# Patient Record
Sex: Female | Born: 1950 | Race: Black or African American | Hispanic: No | Smoking: Former smoker
Health system: Southern US, Community
[De-identification: ages and names within clinical notes are randomized; demographics above are authoritative.]

## PROBLEM LIST (undated history)

## (undated) DIAGNOSIS — E785 Hyperlipidemia, unspecified: Secondary | ICD-10-CM

## (undated) DIAGNOSIS — E559 Vitamin D deficiency, unspecified: Secondary | ICD-10-CM

## (undated) DIAGNOSIS — R413 Other amnesia: Secondary | ICD-10-CM

## (undated) DIAGNOSIS — E039 Hypothyroidism, unspecified: Secondary | ICD-10-CM

## (undated) DIAGNOSIS — I10 Essential (primary) hypertension: Secondary | ICD-10-CM

## (undated) DIAGNOSIS — F32A Depression, unspecified: Secondary | ICD-10-CM

## (undated) HISTORY — DX: Essential (primary) hypertension: I10

## (undated) HISTORY — DX: Depression, unspecified: F32.A

## (undated) HISTORY — DX: Other amnesia: R41.3

## (undated) HISTORY — DX: Hyperlipidemia, unspecified: E78.5

## (undated) HISTORY — PX: COLONOSCOPY: SHX174

## (undated) HISTORY — DX: Hypothyroidism, unspecified: E03.9

## (undated) HISTORY — DX: Vitamin D deficiency, unspecified: E55.9

---

## 2017-02-26 ENCOUNTER — Other Ambulatory Visit: Payer: Self-pay | Admitting: Specialist

## 2017-02-26 DIAGNOSIS — R5381 Other malaise: Secondary | ICD-10-CM

## 2017-04-07 ENCOUNTER — Encounter: Payer: Self-pay | Admitting: Specialist

## 2019-02-16 ENCOUNTER — Ambulatory Visit: Payer: Commercial Indemnity | Admitting: Diagnostic Neuroimaging

## 2019-03-03 ENCOUNTER — Telehealth: Payer: Self-pay | Admitting: Diagnostic Neuroimaging

## 2019-03-03 NOTE — Telephone Encounter (Signed)
lvm for pt to call back to r/s 10/19 appt due to MD being out

## 2019-03-08 ENCOUNTER — Ambulatory Visit: Payer: Commercial Indemnity | Admitting: Diagnostic Neuroimaging

## 2019-04-12 ENCOUNTER — Encounter: Payer: Self-pay | Admitting: *Deleted

## 2019-04-14 ENCOUNTER — Encounter: Payer: Self-pay | Admitting: Diagnostic Neuroimaging

## 2019-04-14 ENCOUNTER — Ambulatory Visit (INDEPENDENT_AMBULATORY_CARE_PROVIDER_SITE_OTHER): Payer: Self-pay | Admitting: Diagnostic Neuroimaging

## 2019-04-14 ENCOUNTER — Other Ambulatory Visit: Payer: Self-pay

## 2019-04-14 VITALS — BP 140/77 | HR 78 | Temp 97.9°F | Ht 63.0 in | Wt 104.6 lb

## 2019-04-14 DIAGNOSIS — G3184 Mild cognitive impairment, so stated: Secondary | ICD-10-CM | POA: Diagnosis not present

## 2019-04-14 DIAGNOSIS — R413 Other amnesia: Secondary | ICD-10-CM

## 2019-04-14 NOTE — Patient Instructions (Signed)
MILD MEMORY LOSS (MMSE 18/30; no change in ADLs; MCI vs mild dementia vs other secondary cause) - check MRI brain, B12, TSH - consider memantine 10mg  at bedtime; increase to twice a day after 1-2 weeks - safety / supervision issues reviewed - caution with driving and finances

## 2019-04-14 NOTE — Progress Notes (Signed)
[facility]  PATIENT: Gail Knapp DOB: 11-25-1950  REFERRING CLINICIAN: Cleda Mccreedy, Md [address] [place],  [place] [address]  HISTORY FROM: patient  REASON FOR VISIT: new consult    HISTORICAL  CHIEF COMPLAINT:  Chief Complaint  Patient presents with  . Memory Loss    rm 6 New Pt "sometimes I forget where I'm going but I remember later"  MMSE 18    HISTORY OF PRESENT ILLNESS:   68 year old female here for evaluation memory loss.  Patient reports subjective mild short-term memory loss and forgetfulness for past 4 to 5 months.  This has been noticed by patient and her son.  No change in ADLs.  Patient previously worked in multiple jobs and retired 2 years ago ([employer], [employer], [place]   She was living in [place] at the time and then moved to [place] to help her daughter with her family.  3 months ago patient decided to move in with son, as it was too stressful living with daughter.  Also in the past 3 months patient has gone back to work at [employer].    Patient still able to take care of personal affairs, hygiene, bathing, dressing, household chores, driving, shopping, going to work.  No change in ADLs.  Patient's mother had some memory problem, possible dementia.  No issues with sleep, stress, anxiety or depression.   REVIEW OF SYSTEMS: Full 14 system review of systems performed and negative with exception of: As per HPI.  ALLERGIES: Allergies  Allergen Reactions  . Latex Rash    HOME MEDICATIONS: Outpatient Medications Prior to Visit  Medication Sig Dispense Refill  . Cholecalciferol (VITAMIN D-3) 125 MCG (5000 UT) TABS Take 1 capsule by mouth daily.    Marland Kitchen diltiazem (CARDIZEM CD) 240 MG 24 hr capsule Take 240 mg by mouth daily.    . hydrochlorothiazide (HYDRODIURIL) 25 MG tablet Take 25 mg by mouth daily.    Marland Kitchen lovastatin (MEVACOR) 20 MG tablet Take 20 mg by mouth at bedtime.    . potassium  chloride (KLOR-CON) 10 MEQ tablet Take 10 mEq by mouth daily.     No facility-administered medications prior to visit.     PAST MEDICAL HISTORY: Past Medical History:  Diagnosis Date  . Hyperlipidemia   . Hypertension   . Memory change     PAST SURGICAL HISTORY: History reviewed. No pertinent surgical history.  FAMILY HISTORY: Family History  Problem Relation Age of Onset  . Hypertension Mother   . Hypertension Brother     SOCIAL HISTORY: Social History   Socioeconomic History  . Marital status: Divorced    Spouse name: Not on file  . Number of children: 2  . Years of education: Not on file  . Highest education level: High school graduate  Occupational History    Comment: [employer] part time  Social Needs  . Financial resource strain: Not on file  . Food insecurity    Worry: Not on file    Inability: Not on file  . Transportation needs    Medical: Not on file    Non-medical: Not on file  Tobacco Use  . Smoking status: Former Smoker    Packs/day: 1.00    Quit date: 04/14/2015    Years since quitting: 4.0  . Smokeless tobacco: Never Used  Substance and Sexual Activity  . Alcohol use: Not Currently  . Drug use: Never  . Sexual activity: Not on file  Lifestyle  .  Physical activity    Days per week: Not on file    Minutes per session: Not on file  . Stress: Not on file  Relationships  . Social Musicianconnections    Talks on phone: Not on file    Gets together: Not on file    Attends religious service: Not on file    Active member of club or organization: Not on file    Attends meetings of clubs or organizations: Not on file    Relationship status: Not on file  . Intimate partner violence    Fear of current or ex partner: Not on file    Emotionally abused: Not on file    Physically abused: Not on file    Forced sexual activity: Not on file  Other Topics Concern  . Not on file  Social History Narrative   04/14/19 Lives with son, [name]   Caffeine-none      PHYSICAL EXAM  GENERAL EXAM/CONSTITUTIONAL: Vitals:  Vitals:   04/14/19 0831  BP: 140/77  Pulse: 78  Temp: 97.9 F (36.6 C)  Weight: 104 lb 9.6 oz (47.4 kg)  Height: 5\' 3"  (1.6 m)     Body mass index is 18.53 kg/m. Wt Readings from Last 3 Encounters:  04/14/19 104 lb 9.6 oz (47.4 kg)     Patient is in no distress; well developed, nourished and groomed; neck is supple  CARDIOVASCULAR:  Examination of carotid arteries is normal; no carotid bruits  Regular rate and rhythm, no murmurs  Examination of peripheral vascular system by observation and palpation is normal  EYES:  Ophthalmoscopic exam of optic discs and posterior segments is normal; no papilledema or hemorrhages  No exam data present  MUSCULOSKELETAL:  Gait, strength, tone, movements noted in Neurologic exam below  NEUROLOGIC: MENTAL STATUS:  MMSE - Mini Mental State Exam 04/14/2019  Orientation to time 3  Orientation to Place 4  Registration 3  Attention/ Calculation 0  Recall 1  Language- name 2 objects 2  Language- repeat 0  Language- follow 3 step command 3  Language- read & follow direction 1  Write a sentence 1  Copy design 0  Total score 18    awake, alert, oriented to person, place  [facility] memory   DECR attention and concentration  language fluent, comprehension intact, naming intact  fund of knowledge appropriate  CRANIAL NERVE:   2nd - no papilledema on fundoscopic exam  2nd, 3rd, 4th, 6th - pupils equal and reactive to light, visual fields full to confrontation, extraocular muscles intact, no nystagmus  5th - facial sensation symmetric  7th - facial strength symmetric  8th - hearing intact  9th - palate elevates symmetrically, uvula midline  11th - shoulder shrug symmetric  12th - tongue protrusion midline  MOTOR:   normal bulk and tone, full strength in the BUE, BLE  SENSORY:   normal and symmetric to light touch, temperature, vibration  COORDINATION:    finger-nose-finger, fine finger movements normal  REFLEXES:   deep tendon reflexes present and symmetric  GAIT/STATION:   narrow based gait     DIAGNOSTIC DATA (LABS, IMAGING, TESTING) - I reviewed patient records, labs, notes, testing and imaging myself where available.  No results found for: WBC, HGB, HCT, MCV, PLT No results found for: NA, K, CL, CO2, GLUCOSE, BUN, CREATININE, CALCIUM, PROT, ALBUMIN, AST, ALT, ALKPHOS, BILITOT, GFRNONAA, GFRAA No results found for: CHOL, HDL, LDLCALC, LDLDIRECT, TRIG, CHOLHDL No results found for: [id number] No results found for: VITAMINB12 No  results found for: TSH     ASSESSMENT AND PLAN  68 y.o. year old female here with mild memory loss, no change in ADLs, with somewhat unexpectedly low MMSE given her history of high school graduation and working career.  We will proceed with further work-up.   Dx:  1. MCI (mild cognitive impairment)   2. Memory loss     PLAN:  MILD MEMORY LOSS (MMSE 18/30; no change in ADLs; MCI vs mild dementia vs other secondary cause) - check MRI brain, B12, TSH - consider memantine 10mg  at bedtime; increase to twice a day after 1-2 weeks - safety / supervision issues reviewed - caregiver resources provided - caution with driving and finances  Orders Placed This Encounter  Procedures  . MR BRAIN W WO CONTRAST  . CBC with Differential/Platelet  . Comprehensive metabolic panel  . Vitamin B12  . TSH   Return for pending if symptoms worsen or fail to improve. pending test results.    , MD 04/14/2019, 9:27 AM Certified in Neurology, Neurophysiology and Neuroimaging  [facility] [address], [address] [place], [place] [phone]

## 2019-04-15 LAB — CBC WITH DIFFERENTIAL/PLATELET
Basophils Absolute: 0 10*3/uL (ref 0.0–0.2)
Basos: 0 %
EOS (ABSOLUTE): 0 10*3/uL (ref 0.0–0.4)
Eos: 0 %
Hematocrit: 37.8 % (ref 34.0–46.6)
Hemoglobin: 11.9 g/dL (ref 11.1–15.9)
Immature Grans (Abs): 0 10*3/uL (ref 0.0–0.1)
Immature Granulocytes: 0 %
Lymphocytes Absolute: 1.2 10*3/uL (ref 0.7–3.1)
Lymphs: 22 %
MCH: 28.7 pg (ref 26.6–33.0)
MCHC: 31.5 g/dL (ref 31.5–35.7)
MCV: 91 fL (ref 79–97)
Monocytes Absolute: 0.4 10*3/uL (ref 0.1–0.9)
Monocytes: 7 %
Neutrophils Absolute: 3.9 10*3/uL (ref 1.4–7.0)
Neutrophils: 71 %
Platelets: 158 10*3/uL (ref 150–450)
RBC: 4.15 x10E6/uL (ref 3.77–5.28)
RDW: 11.3 % — ABNORMAL LOW (ref 11.7–15.4)
WBC: 5.5 10*3/uL (ref 3.4–10.8)

## 2019-04-15 LAB — COMPREHENSIVE METABOLIC PANEL
ALT: 10 IU/L (ref 0–32)
AST: 17 IU/L (ref 0–40)
Albumin/Globulin Ratio: 2 (ref 1.2–2.2)
Albumin: 4.2 g/dL (ref 3.8–4.8)
Alkaline Phosphatase: 116 IU/L (ref 39–117)
BUN/Creatinine Ratio: 14 (ref 12–28)
BUN: 12 mg/dL (ref 8–27)
Bilirubin Total: 0.3 mg/dL (ref 0.0–1.2)
CO2: 25 mmol/L (ref 20–29)
Calcium: 10.1 mg/dL (ref 8.7–10.3)
Chloride: 103 mmol/L (ref 96–106)
Creatinine, Ser: 0.85 mg/dL (ref 0.57–1.00)
GFR calc Af Amer: 82 mL/min/{1.73_m2} (ref >59)
GFR calc non Af Amer: 71 mL/min/{1.73_m2} (ref >59)
Globulin, Total: 2.1 g/dL (ref 1.5–4.5)
Glucose: 81 mg/dL (ref 65–99)
Potassium: 3.7 mmol/L (ref 3.5–5.2)
Sodium: 142 mmol/L (ref 134–144)
Total Protein: 6.3 g/dL (ref 6.0–8.5)

## 2019-04-15 LAB — TSH: TSH: 0.327 u[IU]/mL — ABNORMAL LOW (ref 0.450–4.500)

## 2019-04-15 LAB — VITAMIN B12: Vitamin B-12: 391 pg/mL (ref 232–1245)

## 2019-04-20 ENCOUNTER — Telehealth: Payer: Self-pay | Admitting: *Deleted

## 2019-04-26 ENCOUNTER — Telehealth: Payer: Self-pay | Admitting: Diagnostic Neuroimaging

## 2019-04-26 NOTE — Telephone Encounter (Signed)
I have called patient and her soon x 2 relayed I need to them to call me back to verify insurance for MRI . Thanks Hinton Dyer

## 2019-04-27 NOTE — Telephone Encounter (Signed)
Left vm for patient to call back about lab work results. Pts thyroid is low.  ------

## 2019-05-03 NOTE — Telephone Encounter (Signed)
Spoke with patient who stated she had gotten a message about her labs. She is aware her TSH is slightly low; follow up with PCP and other labs are okay. Patient verbalized understanding, appreciation.

## 2019-05-21 DIAGNOSIS — R413 Other amnesia: Secondary | ICD-10-CM

## 2019-05-21 HISTORY — DX: Other amnesia: R41.3

## 2019-05-24 ENCOUNTER — Telehealth: Payer: Self-pay

## 2019-05-24 NOTE — Telephone Encounter (Signed)
Patient dropped off new insurance and would like to proceed with scheduling MRI. It will need to be sent to GI once Berkley Harvey is completed. DWD

## 2019-05-27 ENCOUNTER — Telehealth: Payer: Self-pay | Admitting: *Deleted

## 2019-05-27 NOTE — Telephone Encounter (Signed)
Pt called need MRI results. Please [phone]

## 2019-05-27 NOTE — Telephone Encounter (Signed)
Per son Gail Knapp, on [place] please call him to schedule MRI.

## 2019-05-27 NOTE — Telephone Encounter (Addendum)
Called patient and asked where she had MRI done because not finding it in EMR. She stated she hasn't had it done, asked what she needs to do. I noted phone note by Annabelle Harman who needed updated insurance information. I asked her for this, skyped Annabelle Harman who then said she had spoken with patient and had insurance info. Son, [name] on DPR got on phone and verified insurance cards we scanned 05/24/19 are correct. He asked to be called to schedule MRI when insurance approves it; will notify Annabelle Harman of this. He verbalized understanding, appreciation.

## 2019-05-27 NOTE — Telephone Encounter (Signed)
Patient called me back and she has the [place] and / medicare . I will Get her MRI started .

## 2019-06-01 NOTE — Telephone Encounter (Signed)
Called and and got auth . For [facility] sent via Fax [phone]. I made them aware they had to call patient's son [name] [phone].

## 2019-06-01 NOTE — Telephone Encounter (Signed)
Berkley Harvey has been done sent to [facility]

## 2019-06-02 NOTE — Telephone Encounter (Signed)
Noted, thank you. DWD  

## 2019-06-23 ENCOUNTER — Ambulatory Visit
Admission: RE | Admit: 2019-06-23 | Discharge: 2019-06-23 | Disposition: A | Discharge status: Home / Self Care | Payer: Medicare HMO | Source: Ambulatory Visit | Attending: Diagnostic Neuroimaging | Admitting: Diagnostic Neuroimaging

## 2019-06-23 DIAGNOSIS — R413 Other amnesia: Secondary | ICD-10-CM

## 2019-06-23 MED ORDER — GADOBENATE DIMEGLUMINE 529 MG/ML IV SOLN
9.0000 mL | Freq: Once | INTRAVENOUS | Status: AC | PRN
  Administered 2019-06-23: 16:00:00 9 mL via INTRAVENOUS

## 2019-07-12 ENCOUNTER — Ambulatory Visit (INDEPENDENT_AMBULATORY_CARE_PROVIDER_SITE_OTHER): Payer: Medicare HMO | Admitting: Diagnostic Neuroimaging

## 2019-07-12 ENCOUNTER — Other Ambulatory Visit: Payer: Self-pay

## 2019-07-12 ENCOUNTER — Encounter: Payer: Self-pay | Admitting: Diagnostic Neuroimaging

## 2019-07-12 VITALS — BP 144/81 | HR 84 | Temp 98.0°F | Ht 63.0 in | Wt 102.0 lb

## 2019-07-12 DIAGNOSIS — R413 Other amnesia: Secondary | ICD-10-CM

## 2019-07-12 DIAGNOSIS — G3184 Mild cognitive impairment, so stated: Secondary | ICD-10-CM

## 2019-07-12 NOTE — Progress Notes (Signed)
[facility]  PATIENT: Gail Knapp DOB: 10/29/1950  REFERRING CLINICIAN: No referring provider defined for this encounter. HISTORY FROM: patient  REASON FOR VISIT: follow up   HISTORICAL  CHIEF COMPLAINT:  Chief Complaint  Patient presents with  . Mild cognitive impairment    rm 6, "review MRI results; I don't get lost, can drive to work, some days worse than others"  MMSE 18    HISTORY OF PRESENT ILLNESS:   UPDATE (07/12/19, VRP): Since last visit, doing about the same. Symptoms are stable. Severity is mild. No alleviating or aggravating factors. Still able to work at [employer].  NO change in ADLs.   PRIOR HPI (04/14/19): 69 year old female here for evaluation memory loss.  Patient reports subjective mild short-term memory loss and forgetfulness for past 4 to 5 months.  This has been noticed by patient and her son.  No change in ADLs.  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   She was living in [facility] [place] at the time and then moved to [place] to help her daughter with her family.  3 months ago patient decided to move in with son, as it was too stressful living with daughter.  Also in the past 3 months patient has gone back to work at [employer].    Patient still able to take care of personal affairs, hygiene, bathing, dressing, household chores, driving, shopping, going to work.  No change in ADLs.  Patient's mother had some memory problem, possible dementia.  No issues with sleep, stress, anxiety or depression.   REVIEW OF SYSTEMS: Full 14 system review of systems performed and negative with exception of: As per HPI.  ALLERGIES: Allergies  Allergen Reactions  . Latex Rash    HOME MEDICATIONS: Outpatient Medications Prior to Visit  Medication Sig Dispense Refill  . Cholecalciferol (VITAMIN D-3) 125 MCG (5000 UT) TABS Take 1 capsule by mouth daily.    Marland Kitchen  diltiazem (CARDIZEM CD) 240 MG 24 hr capsule Take 240 mg by mouth daily.    . hydrochlorothiazide (HYDRODIURIL) 25 MG tablet Take 25 mg by mouth daily.    Marland Kitchen lovastatin (MEVACOR) 20 MG tablet Take 20 mg by mouth at bedtime.    . potassium chloride (KLOR-CON) 10 MEQ tablet Take 10 mEq by mouth daily.     No facility-administered medications prior to visit.    PAST MEDICAL HISTORY: Past Medical History:  Diagnosis Date  . Hyperlipidemia   . Hypertension   . Memory change     PAST SURGICAL HISTORY: No past surgical history on file.  FAMILY HISTORY: Family History  Problem Relation Age of Onset  . Hypertension Mother   . Other Mother        "memory loss"  . Hypertension Brother     SOCIAL HISTORY: Social History   Socioeconomic History  . Marital status: Divorced    Spouse name: Not on file  . Number of children: 2  . Years of education: 73  . Highest education level: High school graduate  Occupational History    Comment: [employer] part time  Tobacco Use  . Smoking status: Former Smoker    Packs/day: 1.00    Quit date: 04/14/2015    Years since quitting: 4.2  . Smokeless tobacco: Never Used  Substance and Sexual Activity  . Alcohol use: Not Currently  . Drug use: Never  . Sexual activity: Not on file  Other Topics Concern  .  Not on file  Social History Narrative   2/22/21still  Lives with son, Biagio Quint   Social Determinants of Health   Financial Resource Strain:   . Difficulty of Paying Living Expenses: Not on file  Food Insecurity:   . Worried About Charity fundraiser in the Last Year: Not on file  . Ran Out of Food in the Last Year: Not on file  Transportation Needs:   . Lack of Transportation (Medical): Not on file  . Lack of Transportation (Non-Medical): Not on file  Physical Activity:   . Days of Exercise per Week: Not on file  . Minutes of Exercise per Session: Not on file  Stress:   . Feeling of Stress : Not on file  Social Connections:    . Frequency of Communication with Friends and Family: Not on file  . Frequency of Social Gatherings with Friends and Family: Not on file  . Attends Religious Services: Not on file  . Active Member of Clubs or Organizations: Not on file  . Attends Archivist Meetings: Not on file  . Marital Status: Not on file  Intimate Partner Violence:   . Fear of Current or Ex-Partner: Not on file  . Emotionally Abused: Not on file  . Physically Abused: Not on file  . Sexually Abused: Not on file     PHYSICAL EXAM  GENERAL EXAM/CONSTITUTIONAL: Vitals:  Vitals:   07/12/19 1618  BP: (!) 144/81  Pulse: 84  Temp: 98 F (36.7 C)  Weight: 102 lb (46.3 kg)  Height: 5\' 3"  (1.6 m)   Body mass index is 18.07 kg/m. Wt Readings from Last 3 Encounters:  07/12/19 102 lb (46.3 kg)  04/14/19 104 lb 9.6 oz (47.4 kg)    Patient is in no distress; well developed, nourished and groomed; neck is supple  CARDIOVASCULAR:  Examination of carotid arteries is normal; no carotid bruits  Regular rate and rhythm, no murmurs  Examination of peripheral vascular system by observation and palpation is normal  EYES:  Ophthalmoscopic exam of optic discs and posterior segments is normal; no papilledema or hemorrhages No exam data present  MUSCULOSKELETAL:  Gait, strength, tone, movements noted in Neurologic exam below  NEUROLOGIC: MENTAL STATUS:  MMSE - Graves Exam 07/12/2019 04/14/2019  Orientation to time 2 3  Orientation to Place 4 4  Registration 3 3  Attention/ Calculation 0 0  Recall 2 1  Language- name 2 objects 2 2  Language- repeat 0 0  Language- follow 3 step command 3 3  Language- read & follow direction 1 1  Write a sentence 1 1  Copy design 0 0  Total score 18 18    awake, alert, oriented to person, place  [facility] memory   DECR attention and concentration  language fluent, comprehension intact, naming intact  fund of knowledge appropriate  CRANIAL NERVE:     2nd - no papilledema on fundoscopic exam  2nd, 3rd, 4th, 6th - pupils equal and reactive to light, visual fields full to confrontation, extraocular muscles intact, no nystagmus  5th - facial sensation symmetric  7th - facial strength symmetric  8th - hearing intact  9th - palate elevates symmetrically, uvula midline  11th - shoulder shrug symmetric  12th - tongue protrusion midline  MOTOR:   normal bulk and tone, full strength in the BUE, BLE  SENSORY:   normal and symmetric to light touch, temperature, vibration  COORDINATION:   finger-nose-finger, fine finger movements  normal  REFLEXES:   deep tendon reflexes present and symmetric  GAIT/STATION:   narrow based gait     DIAGNOSTIC DATA (LABS, IMAGING, TESTING) - I reviewed patient records, labs, notes, testing and imaging myself where available.  Lab Results  Component Value Date   WBC 5.5 04/14/2019   HGB 11.9 04/14/2019   HCT 37.8 04/14/2019   MCV 91 04/14/2019   PLT 158 04/14/2019      Component Value Date/Time   NA 142 04/14/2019 0953   K 3.7 04/14/2019 0953   CL 103 04/14/2019 0953   CO2 25 04/14/2019 0953   GLUCOSE 81 04/14/2019 0953   BUN 12 04/14/2019 0953   CREATININE 0.85 04/14/2019 0953   CALCIUM 10.1 04/14/2019 0953   PROT 6.3 04/14/2019 0953   ALBUMIN 4.2 04/14/2019 0953   AST 17 04/14/2019 0953   ALT 10 04/14/2019 0953   ALKPHOS 116 04/14/2019 0953   BILITOT 0.3 04/14/2019 0953   GFRNONAA 71 04/14/2019 0953   GFRAA 82 04/14/2019 0953   No results found for: CHOL, HDL, LDLCALC, LDLDIRECT, TRIG, CHOLHDL No results found for: YEBX4D Lab Results  Component Value Date   VITAMINB12 391 04/14/2019   Lab Results  Component Value Date   TSH 0.327 (L) 04/14/2019    06/23/19 MRI brain (with and without): -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 -Mild scattered foci of nonspecific lesions. -No acute findings.   ASSESSMENT AND PLAN  69 y.o. year old female  here with mild memory loss, no change in ADLs, with somewhat unexpectedly low MMSE given her history of high school graduation and working career.    Dx:  1. MCI (mild cognitive impairment)   2. Memory loss     PLAN:  MILD MEMORY LOSS (MMSE 18/30; no change in ADLs; MCI vs mild dementia vs other secondary cause) - may consider memantine 10mg  at bedtime; increase to twice a day after 1-2 weeks - safety / supervision issues reviewed - caregiver resources provided - caution with driving and finances - encouraged patient to establish with PCP - return in 6 months with son  Return in about 6 months (around 01/09/2020) for return with son at next visit.   01/11/2020, MD 07/12/2019, 4:59 PM Certified in Neurology, Neurophysiology and Neuroimaging  [facility] [address] [place], [place] [phone]

## 2020-01-10 ENCOUNTER — Ambulatory Visit: Payer: Medicare HMO | Admitting: Diagnostic Neuroimaging

## 2020-01-10 ENCOUNTER — Telehealth: Payer: Self-pay | Admitting: *Deleted

## 2020-01-10 ENCOUNTER — Encounter: Payer: Self-pay | Admitting: Diagnostic Neuroimaging

## 2020-01-10 NOTE — Telephone Encounter (Signed)
Patient was no show for follow up today. 

## 2020-02-28 ENCOUNTER — Encounter: Payer: Self-pay | Admitting: Diagnostic Neuroimaging

## 2020-02-28 ENCOUNTER — Telehealth: Payer: Self-pay | Admitting: Diagnostic Neuroimaging

## 2020-02-28 ENCOUNTER — Other Ambulatory Visit: Payer: Self-pay

## 2020-02-28 ENCOUNTER — Ambulatory Visit: Payer: Medicare HMO | Admitting: Diagnostic Neuroimaging

## 2020-02-28 VITALS — BP 140/72 | HR 64 | Ht 65.0 in | Wt 106.0 lb

## 2020-02-28 DIAGNOSIS — F0390 Unspecified dementia without behavioral disturbance: Secondary | ICD-10-CM

## 2020-02-28 DIAGNOSIS — F03A0 Unspecified dementia, mild, without behavioral disturbance, psychotic disturbance, mood disturbance, and anxiety: Secondary | ICD-10-CM

## 2020-02-28 NOTE — Progress Notes (Signed)
[facility]  PATIENT: Gail Knapp DOB: [birth year]  REFERRING CLINICIAN: No referring provider defined for this encounter. HISTORY FROM: patient and son REASON FOR VISIT: follow up   HISTORICAL  CHIEF COMPLAINT:  Chief Complaint  Patient presents with  . MCI    rm 7, 6 month FU    HISTORY OF PRESENT ILLNESS:   UPDATE (02/28/20, VRP): Since last visit, some progression of memory loss issues. Son at the visit here today. Has had some events of forgetting where she parked car when remote battery failed; also has lost debit card twice. Still working and driving. No alleviating or aggravating factors. Tolerating meds.   UPDATE (07/12/19, VRP): Since last visit, doing about the same. Symptoms are stable. Severity is mild. No alleviating or aggravating factors. Still able to work at [employer].  NO change in ADLs.   PRIOR HPI (04/14/19): 69 year old female here for evaluation memory loss.  Patient reports subjective mild short-term memory loss and forgetfulness for past 4 to 5 months.  This has been noticed by patient and her son.  No change in ADLs.  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   She was living in [facility] [place] at the time and then moved to [place] to help her daughter with her family.  3 months ago patient decided to move in with son, as it was too stressful living with daughter.  Also in the past 3 months patient has gone back to work at [employer].    Patient still able to take care of personal affairs, hygiene, bathing, dressing, household chores, driving, shopping, going to work.  No change in ADLs.  Patient's mother had some memory problem, possible dementia.  No issues with sleep, stress, anxiety or depression.   REVIEW OF SYSTEMS: Full 14 system review of systems performed and negative with exception of: As per HPI.  ALLERGIES: Allergies  Allergen Reactions   . Latex Rash    HOME MEDICATIONS: Outpatient Medications Prior to Visit  Medication Sig Dispense Refill  . Cholecalciferol (VITAMIN D-3) 125 MCG (5000 UT) TABS Take 1 capsule by mouth daily.    Marland Kitchen diltiazem (CARDIZEM CD) 240 MG 24 hr capsule Take 240 mg by mouth daily.    . hydrochlorothiazide (HYDRODIURIL) 25 MG tablet Take 25 mg by mouth daily.    Marland Kitchen lovastatin (MEVACOR) 20 MG tablet Take 20 mg by mouth at bedtime.    . potassium chloride (KLOR-CON) 10 MEQ tablet Take 10 mEq by mouth daily.     No facility-administered medications prior to visit.    PAST MEDICAL HISTORY: Past Medical History:  Diagnosis Date  . Hyperlipidemia   . Hypertension   . Memory change     PAST SURGICAL HISTORY: No past surgical history on file.  FAMILY HISTORY: Family History  Problem Relation Age of Onset  . Hypertension Mother   . Other Mother        "memory loss"  . Hypertension Brother     SOCIAL HISTORY: Social History   Socioeconomic History  . Marital status: Divorced    Spouse name: Not on file  . Number of children: 2  . Years of education: 43  . Highest education level: High school graduate  Occupational History    Comment: [employer] part time  Tobacco Use  . Smoking status: Former Smoker    Packs/day: 1.00    Quit date: 04/14/2015    Years since quitting:  4.8  . Smokeless tobacco: Never Used  Substance and Sexual Activity  . Alcohol use: Not Currently  . Drug use: Never  . Sexual activity: Not on file  Other Topics Concern  . Not on file  Social History Narrative   2/22/21still  Lives with son, Gail Knapp 02/28/20   Caffeine-none   Social Determinants of Health   Financial Resource Strain:   . Difficulty of Paying Living Expenses: Not on file  Food Insecurity:   . Worried About Programme researcher, broadcasting/film/video in the Last Year: Not on file  . Ran Out of Food in the Last Year: Not on file  Transportation Needs:   . Lack of Transportation (Medical): Not on file  . Lack of  Transportation (Non-Medical): Not on file  Physical Activity:   . Days of Exercise per Week: Not on file  . Minutes of Exercise per Session: Not on file  Stress:   . Feeling of Stress : Not on file  Social Connections:   . Frequency of Communication with Friends and Family: Not on file  . Frequency of Social Gatherings with Friends and Family: Not on file  . Attends Religious Services: Not on file  . Active Member of Clubs or Organizations: Not on file  . Attends Banker Meetings: Not on file  . Marital Status: Not on file  Intimate Partner Violence:   . Fear of Current or Ex-Partner: Not on file  . Emotionally Abused: Not on file  . Physically Abused: Not on file  . Sexually Abused: Not on file     PHYSICAL EXAM  GENERAL EXAM/CONSTITUTIONAL: Vitals:  Vitals:   02/28/20 1500  Weight: 106 lb (48.1 kg)  Height: 5\' 5"  (1.651 m)   Body mass index is 17.64 kg/m. Wt Readings from Last 3 Encounters:  02/28/20 106 lb (48.1 kg)  07/12/19 102 lb (46.3 kg)  04/14/19 104 lb 9.6 oz (47.4 kg)    Patient is in no distress; well developed, nourished and groomed; neck is supple  CARDIOVASCULAR:  Examination of carotid arteries is normal; no carotid bruits  Regular rate and rhythm, no murmurs  Examination of peripheral vascular system by observation and palpation is normal  EYES:  Ophthalmoscopic exam of optic discs and posterior segments is normal; no papilledema or hemorrhages No exam data present  MUSCULOSKELETAL:  Gait, strength, tone, movements noted in Neurologic exam below  NEUROLOGIC: MENTAL STATUS:  MMSE - Mini Mental State Exam 02/28/2020 07/12/2019 04/14/2019  Orientation to time 1 2 3   Orientation to Place 4 4 4   Registration 3 3 3   Attention/ Calculation 0 0 0  Recall 1 2 1   Language- name 2 objects 2 2 2   Language- repeat 1 0 0  Language- follow 3 step command 3 3 3   Language- read & follow direction 1 1 1   Write a sentence 1 1 1   Copy  design 0 0 0  Total score 17 18 18     awake, alert, oriented to person, place  DECR memory   DECR attention and concentration  language fluent, comprehension intact, naming intact  fund of knowledge appropriate  CRANIAL NERVE:   2nd - no papilledema on fundoscopic exam  2nd, 3rd, 4th, 6th - pupils equal and reactive to light, visual fields full to confrontation, extraocular muscles intact, no nystagmus  5th - facial sensation symmetric  7th - facial strength symmetric  8th - hearing intact  9th - palate elevates symmetrically, uvula midline  11th - shoulder  shrug symmetric  12th - tongue protrusion midline  MOTOR:   normal bulk and tone, full strength in the BUE, BLE  SENSORY:   normal and symmetric to light touch, temperature, vibration  COORDINATION:   finger-nose-finger, fine finger movements normal  REFLEXES:   deep tendon reflexes present and symmetric  GAIT/STATION:   narrow based gait     DIAGNOSTIC DATA (LABS, IMAGING, TESTING) - I reviewed patient records, labs, notes, testing and imaging myself where available.  Lab Results  Component Value Date   WBC 5.5 04/14/2019   HGB 11.9 04/14/2019   HCT 37.8 04/14/2019   MCV 91 04/14/2019   PLT 158 04/14/2019      Component Value Date/Time   NA 142 04/14/2019 0953   K 3.7 04/14/2019 0953   CL 103 04/14/2019 0953   CO2 25 04/14/2019 0953   GLUCOSE 81 04/14/2019 0953   BUN 12 04/14/2019 0953   CREATININE 0.85 04/14/2019 0953   CALCIUM 10.1 04/14/2019 0953   PROT 6.3 04/14/2019 0953   ALBUMIN 4.2 04/14/2019 0953   AST 17 04/14/2019 0953   ALT 10 04/14/2019 0953   ALKPHOS 116 04/14/2019 0953   BILITOT 0.3 04/14/2019 0953   GFRNONAA 71 04/14/2019 0953   GFRAA 82 04/14/2019 0953   No results found for: CHOL, HDL, LDLCALC, LDLDIRECT, TRIG, CHOLHDL No results found for: LEXN1Z Lab Results  Component Value Date   VITAMINB12 391 04/14/2019   Lab Results  Component Value Date   TSH  0.327 (L) 04/14/2019    06/23/19 MRI brain (with and without): -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 -Mild scattered foci of nonspecific lesions. -No acute findings.   ASSESSMENT AND PLAN  70 y.o. year old female here with mild memory loss, some changes in ADLs, with unexpectedly low MMSE given her history of high school graduation and working career.  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   Dx:  1. Mild dementia (HCC)      PLAN:  MILD MEMORY LOSS (MMSE 17/30; mild-moderate dementia) - consider memantine 10mg  at bedtime; increase to twice a day after 1-2 weeks - safety / supervision issues reviewed - daily physical activity / exercise (at least 15-30 minutes) - eat more plants / vegetables - increase social activities, brain stimulation, games, puzzles, hobbies, crafts, arts, music - aim for at least 7-8 hours sleep per night (or more) - avoid smoking and alcohol - caregiver resources provided - recommend to stop driving; caution with finances and medications - follow up PCP  Return for return to PCP, pending if symptoms worsen or fail to improve.    , MD 02/28/2020, 3:18 PM Certified in Neurology, Neurophysiology and Neuroimaging  [facility] [address] [place], [place] [phone]

## 2020-02-28 NOTE — Telephone Encounter (Signed)
Called son who gave list of patient's current medications.

## 2020-02-28 NOTE — Telephone Encounter (Signed)
Pt's son(on [place], [name]) is asking for a call from Wynelle Cleveland re: the requested list of pt's medications, please call.

## 2020-02-28 NOTE — Patient Instructions (Signed)
MILD MEMORY LOSS (MMSE 17/30; mild-moderate dementia) - consider memantine 10mg  at bedtime; increase to twice a day after 1-2 weeks - safety / supervision issues reviewed - daily physical activity / exercise (at least 15-30 minutes) - eat more plants / vegetables - increase social activities, brain stimulation, games, puzzles, hobbies, crafts, arts, music - aim for at least 7-8 hours sleep per night (or more) - avoid smoking and alcohol - caregiver resources provided - recommend to stop driving; caution with finances and medications - follow up PCP

## 2020-03-28 ENCOUNTER — Other Ambulatory Visit: Payer: Self-pay

## 2020-03-28 ENCOUNTER — Encounter: Payer: Self-pay | Admitting: Medical

## 2020-03-28 ENCOUNTER — Ambulatory Visit (INDEPENDENT_AMBULATORY_CARE_PROVIDER_SITE_OTHER): Payer: Medicare HMO | Admitting: Medical

## 2020-03-28 VITALS — BP 128/80 | HR 78 | Temp 98.7°F | Ht 64.5 in | Wt 108.2 lb

## 2020-03-28 DIAGNOSIS — G3184 Mild cognitive impairment, so stated: Secondary | ICD-10-CM | POA: Diagnosis not present

## 2020-03-28 DIAGNOSIS — E785 Hyperlipidemia, unspecified: Secondary | ICD-10-CM | POA: Diagnosis not present

## 2020-03-28 DIAGNOSIS — R7989 Other specified abnormal findings of blood chemistry: Secondary | ICD-10-CM | POA: Insufficient documentation

## 2020-03-28 DIAGNOSIS — E559 Vitamin D deficiency, unspecified: Secondary | ICD-10-CM | POA: Insufficient documentation

## 2020-03-28 DIAGNOSIS — Z23 Encounter for immunization: Secondary | ICD-10-CM | POA: Diagnosis not present

## 2020-03-28 DIAGNOSIS — I10 Essential (primary) hypertension: Secondary | ICD-10-CM | POA: Diagnosis not present

## 2020-03-28 DIAGNOSIS — Z7189 Other specified counseling: Secondary | ICD-10-CM | POA: Insufficient documentation

## 2020-03-28 DIAGNOSIS — E2839 Other primary ovarian failure: Secondary | ICD-10-CM

## 2020-03-28 MED ORDER — VITAMIN D-3 125 MCG (5000 UT) PO TABS: 1.0000 | ORAL_TABLET | Freq: Every day | ORAL | 2 refills | Status: DC

## 2020-03-28 MED ORDER — MEMANTINE HCL 10 MG PO TABS: ORAL_TABLET | ORAL | 1 refills | Status: DC

## 2020-03-28 NOTE — Progress Notes (Signed)
Subjective:  Gail Knapp is a 69 y.o. female who presents for Chief Complaint  Patient presents with  . other    new pt. est. memory issues      Medical team: Dr. Joycelyn Schmid   Here with son today who is my patient.  She is concerned about memory.   Son is concerned about pharmacy giving refills all the time, but she doesn't seem to be having follow ups with her doctor.   Was seeing Dr. Bruna Potter prior.  Has been seeing neurology.  Is compliant with medication for bp and cholesterol without c/o.   No chest pain, no sob, no edema.  Has been on thyroid medicaiton in remote past.   No headaches, no palpations .  Appetite not the best but does eat regular meals  Wants flu shot today  No other aggravating or relieving factors.    No other c/o.  The following portions of the patient's history were reviewed and updated as appropriate: allergies, current medications, past family history, past medical history, past social history, past surgical history and problem list.  ROS Otherwise as in subjective above  Objective: BP 128/80   Pulse 78   Temp 98.7 F (37.1 C)   Ht 5' 4.5" (1.638 m)   Wt 108 lb 3.2 oz (49.1 kg)   LMP  (LMP Unknown)   BMI 18.29 kg/m   General appearance: alert, no distress, well developed, well nourished Neck: supple, no lymphadenopathy, no thyromegaly, no masses Heart: RRR, normal S1, S2, no murmurs Lungs: CTA bilaterally, no wheezes, rhonchi, or rales Pulses: 2+ radial pulses, 2+ pedal pulses, normal cap refill Ext: no edema Psych: pleasant, good eye contact, answers questions appropriately    Assessment: Encounter Diagnoses  Name Primary?  . Essential hypertension, benign Yes  . Hyperlipidemia, unspecified hyperlipidemia type   . Abnormal thyroid blood test   . Need for immunization against influenza   . Mild cognitive impairment with memory loss   . Vitamin D deficiency   . Estrogen deficiency   . Advanced directives,  counseling/discussion      Plan: Reviewed recent neurology notes.  We discussed her cognitions, memory, daily activities.    Mild memory loss per Dr. Marjory Lies.  She hasn't yet started the [facility] per Dr. Richrd Humbles recommendations.  She is willing to begin this now, wants me to send the script.   I will let Dr. Marjory Lies know as well that she is going to start this.  Counseled on routine follow ups, exercise, diet, personal safety, safety online and on the phone, preventing scammers taking her money or personal data.   Overall seems to be doing well  Prior abnormal thyroid function.  Labs today.  Get back on vit D  Counseled on the influenza virus vaccine.  Vaccine information sheet given.   High dose Influenza vaccine given after consent obtained.  She will get me copy of health care advanced directives.    Gennette was seen today for other.  Diagnoses and all orders for this visit:  Essential hypertension, benign -     Comprehensive metabolic panel -     Lipid panel  Hyperlipidemia, unspecified hyperlipidemia type -     Comprehensive metabolic panel -     Lipid panel  Abnormal thyroid blood test -     Comprehensive metabolic panel -     CBC with Differential/Platelet -     TSH -     T4, free  Need for immunization against influenza -  Flu Vaccine QUAD High Dose(Fluad)  Mild cognitive impairment with memory loss  Vitamin D deficiency  Estrogen deficiency  Advanced directives, counseling/discussion  Other orders -     Cholecalciferol (VITAMIN D-3) 125 MCG (5000 UT) TABS; Take 1 capsule by mouth daily. -     memantine (NAMENDA) 10 MG tablet; QHS for 2 weeks, then BID    Follow up: pending labs

## 2020-03-29 ENCOUNTER — Other Ambulatory Visit: Payer: Self-pay | Admitting: Medical

## 2020-03-29 DIAGNOSIS — R7989 Other specified abnormal findings of blood chemistry: Secondary | ICD-10-CM

## 2020-03-29 LAB — LIPID PANEL
Chol/HDL Ratio: 2.3 ratio (ref 0.0–4.4)
Cholesterol, Total: 233 mg/dL — ABNORMAL HIGH (ref 100–199)
HDL: 100 mg/dL (ref >39)
LDL Chol Calc (NIH): 121 mg/dL — ABNORMAL HIGH (ref 0–99)
Triglycerides: 70 mg/dL (ref 0–149)
VLDL Cholesterol Cal: 12 mg/dL (ref 5–40)

## 2020-03-29 LAB — CBC WITH DIFFERENTIAL/PLATELET
Basophils Absolute: 0 10*3/uL (ref 0.0–0.2)
Basos: 0 %
EOS (ABSOLUTE): 0 10*3/uL (ref 0.0–0.4)
Eos: 0 %
Hematocrit: 38.8 % (ref 34.0–46.6)
Hemoglobin: 12.6 g/dL (ref 11.1–15.9)
Immature Grans (Abs): 0 10*3/uL (ref 0.0–0.1)
Immature Granulocytes: 0 %
Lymphocytes Absolute: 1 10*3/uL (ref 0.7–3.1)
Lymphs: 18 %
MCH: 29.1 pg (ref 26.6–33.0)
MCHC: 32.5 g/dL (ref 31.5–35.7)
MCV: 90 fL (ref 79–97)
Monocytes Absolute: 0.3 10*3/uL (ref 0.1–0.9)
Monocytes: 6 %
Neutrophils Absolute: 4.2 10*3/uL (ref 1.4–7.0)
Neutrophils: 76 %
Platelets: 167 10*3/uL (ref 150–450)
RBC: 4.33 x10E6/uL (ref 3.77–5.28)
RDW: 11.7 % (ref 11.7–15.4)
WBC: 5.7 10*3/uL (ref 3.4–10.8)

## 2020-03-29 LAB — COMPREHENSIVE METABOLIC PANEL
ALT: 10 IU/L (ref 0–32)
AST: 18 IU/L (ref 0–40)
Albumin/Globulin Ratio: 2.1 (ref 1.2–2.2)
Albumin: 4.5 g/dL (ref 3.8–4.8)
Alkaline Phosphatase: 133 IU/L — ABNORMAL HIGH (ref 44–121)
BUN/Creatinine Ratio: 12 (ref 12–28)
BUN: 11 mg/dL (ref 8–27)
Bilirubin Total: 0.5 mg/dL (ref 0.0–1.2)
CO2: 25 mmol/L (ref 20–29)
Calcium: 10.6 mg/dL — ABNORMAL HIGH (ref 8.7–10.3)
Chloride: 105 mmol/L (ref 96–106)
Creatinine, Ser: 0.89 mg/dL (ref 0.57–1.00)
GFR calc Af Amer: 77 mL/min/{1.73_m2} (ref >59)
GFR calc non Af Amer: 67 mL/min/{1.73_m2} (ref >59)
Globulin, Total: 2.1 g/dL (ref 1.5–4.5)
Glucose: 78 mg/dL (ref 65–99)
Potassium: 4.3 mmol/L (ref 3.5–5.2)
Sodium: 142 mmol/L (ref 134–144)
Total Protein: 6.6 g/dL (ref 6.0–8.5)

## 2020-03-29 LAB — T4, FREE: Free T4: 0.86 ng/dL (ref 0.82–1.77)

## 2020-03-29 LAB — TSH: TSH: 0.208 u[IU]/mL — ABNORMAL LOW (ref 0.450–4.500)

## 2020-05-20 DIAGNOSIS — Z743 Need for continuous supervision: Secondary | ICD-10-CM

## 2020-05-20 HISTORY — DX: Need for continuous supervision: Z74.3

## 2020-05-22 ENCOUNTER — Ambulatory Visit: Payer: Medicare HMO

## 2020-06-02 ENCOUNTER — Ambulatory Visit: Payer: Medicare HMO | Admitting: Medical

## 2020-06-06 ENCOUNTER — Other Ambulatory Visit: Payer: Self-pay | Admitting: Medical

## 2020-06-07 ENCOUNTER — Other Ambulatory Visit: Payer: Self-pay

## 2020-06-07 ENCOUNTER — Ambulatory Visit (INDEPENDENT_AMBULATORY_CARE_PROVIDER_SITE_OTHER): Payer: Medicare HMO | Admitting: Medical

## 2020-06-07 ENCOUNTER — Encounter: Payer: Self-pay | Admitting: Medical

## 2020-06-07 VITALS — BP 142/78 | HR 89 | Ht 64.5 in | Wt 104.2 lb

## 2020-06-07 DIAGNOSIS — E785 Hyperlipidemia, unspecified: Secondary | ICD-10-CM | POA: Diagnosis not present

## 2020-06-07 DIAGNOSIS — I10 Essential (primary) hypertension: Secondary | ICD-10-CM | POA: Diagnosis not present

## 2020-06-07 DIAGNOSIS — R413 Other amnesia: Secondary | ICD-10-CM | POA: Insufficient documentation

## 2020-06-07 DIAGNOSIS — R7989 Other specified abnormal findings of blood chemistry: Secondary | ICD-10-CM

## 2020-06-07 DIAGNOSIS — E559 Vitamin D deficiency, unspecified: Secondary | ICD-10-CM | POA: Diagnosis not present

## 2020-06-07 NOTE — Patient Instructions (Addendum)
Gail Knapp will check medications to see if it appears mom is not taking certain medications  Use a week box to dispense medications to make it easier for mom  Go visit and interview Assisted Living facilities to understand the process if you were to ever need them  Follow up with endocrinology as planned this month  Make a follow up appointment with Dr. Danae Orleans regarding memory concerns, [facility] [phone]  Get help or line up help where needed to check in on mom, to ensure safety  safe proof the house.   Fall Prevention in Hospitals, Adult Being a patient in the hospital puts you at risk for falling. Falls can cause serious injury and harm, but they can be prevented. It is important to understand what puts you at risk for falling and what you and your health care team can do to prevent you from falling. If you or a loved one falls at the hospital, it is important to tell hospital staff about it. What increases the risk for falls? Certain conditions and treatments may increase your risk of falling in the hospital. These include:  Being in an unfamiliar environment, especially when using the bathroom at night.  Having surgery.  Being on bed rest.  Taking many medicines or certain types of medicines, such as sleeping pills.  Having tubes in place, such as IV lines or catheters. Other risk factors for falls in a hospital include:  Having difficulty with hearing or vision.  Having a change in thinking or behavior, such as confusion.  Having depression.  Having trouble with balance.  Being a female.  Feeling dizzy.  Needing to use the toilet frequently.  Having fallen during the past three months.  Having low blood pressure. What are some strategies for preventing falls? If you or a loved one has to stay in the hospital:  Ask about which fall prevention strategies will be in place. Do not hesitate to speak up if you notice that the fall prevention plan has  changed.  Ask for help moving around, especially after surgery or when feeling unwell.  If you have been asked to call for help when getting up, do not get up by yourself. Asking for help with getting up is for your safety, and the staff is there to help you.  Wear nonskid footwear.  Get up slowly, and sit at the side of the bed for a few minutes before standing up.  Keep items you need, such as the nurse call button or a phone, close to you so that you do not need to reach for them.  Wear eyeglasses or hearing aids if you have them.  Have someone stay in the hospital with you or your loved one.  Ask if sleeping pills or other medicines that can cause confusion are necessary.   What does the hospital staff do to help prevent falls? Hospitals have systems in place to prevent falls and accidents, which may involve:  Discussing your fall risks and making a personalized fall prevention plan.  Checking in regularly to see if you need help.  Placing an arm band on your wrist or a sign near your room to alert other staff of your needs.  Using an alarm on your hospital bed. This is an alarm that goes off if you get out of bed and forget to call for help.  Keeping the bed in a low and locked position.  Keeping the area around the bed and bathroom well-lit and  free from clutter.  Keeping your room quiet, so that you can sleep and be well-rested.  Using safety equipment, such as: ? A belt around your waist. ? Walkers, crutches, and other devices for support. ? Safety beds, such as low beds or cushions on the floor next to the bed.  Having a staff person stay with you (one-on-one observation), even when you are using the bathroom. This is for your safety.  Using video monitoring. This allows a staff member to come to help you if you need help.   What other actions can I take to lower my risk of falls?  Check in regularly with your health care provider or pharmacist to review all of  the medicines that you take.  Make sure that you have a regular exercise program to stay fit. This will help you maintain your balance.  Talk with a physical therapist or trainer if recommended by your health care provider. They can help you to improve your strength, balance, and endurance.  If you are over age 40: ? Ask your health care provider if you need a calcium or vitamin D supplement. ? Have your eyes and hearing checked every year. ? Have your feet checked every year. Where to find more information You can find more information about fall prevention from the Centers for Disease Control and Prevention: [employer]si Summary  Being in an unfamiliar environment, such as the hospital, increases your risk for falling.  If you have been asked to call for help when getting up, do not get up by yourself. Asking for help with getting up is for your safety, and the staff is there to help you.  Ask about which fall prevention strategies will be in place. Do not hesitate to speak up if you notice that the fall prevention plan has changed.  If you or a loved one falls, tell the hospital staff. This is important. This information is not intended to replace advice given to you by your health care provider. Make sure you discuss any questions you have with your health care provider. Document Revised: 04/18/2017 Document Reviewed: 12/18/2016 Elsevier Patient Education  2021 Elsevier Inc.      Memory Compensation Strategies  10. Use "WARM" strategy.  W= write it down  A= associate it  R= repeat it  M= make a mental note  2.   You can keep a Glass blower/designer.  Use a 3-ring notebook with sections for the following: calendar, important names and phone numbers,  medications, doctors' names/phone numbers, lists/reminders, and a section to journal what you did  each day.   3.    Use a calendar to write appointments down.  4.    Write yourself a schedule for the day.  This can be  placed on the calendar or in a separate section of the Memory Notebook.  Keeping a  regular schedule can help memory.  5.    Use medication organizer with sections for each day or morning/evening pills.  You may need help loading it  6.    Keep a basket, or pegboard by the door.  Place items that you need to take out with you in the basket or on the pegboard.  You may also want to  include a message board for reminders.  7.    Use sticky notes.  Place sticky notes with reminders in a place where the task is performed.  For example: " turn off the  stove" placed by the stove, "lock  the door" placed on the door at eye level, " take your medications" on  the bathroom mirror or by the place where you normally take your medications.  8.    Use alarms/timers.  Use while cooking to remind yourself to check on food or as a reminder to take your medicine, or as a  reminder to make a call, or as a reminder to perform another task, etc.

## 2020-06-07 NOTE — Progress Notes (Signed)
Subjective:  Gail Knapp is a 70 y.o. female who presents for Chief Complaint  Patient presents with  . Dementia    Concerns about being alone.      Medical team: Dr. Odessa Fleming, Kermit Balo, PA-C here for primary care, established care here 03/28/20.  Here with son today who is my patient.   She had her COVID booster 3 weeks ago  Last visit November we discussed memory.  She had already seen neurology about this and have been advised to start Namenda but she had not started it yet.  Son does not think she has started since last visit either  Son brought her back in today because he is concerned that she has gotten a lot worse even in the last few months.  He is working out of town some.  She lives with him.  Some new things that he notes is that while out of town he has a ring alarm on the door that allows you to talk back to person.  Recently she was at the door at 5 AM and he asked her where she was going and she said she was coming to pick him up, although he was not going to be home for a couple more days.   this is happened on a few different occasions.  She will be seen on the ring alarm going to the door late at night thinking someone was outside but there is no one there.    At times he will call her but cannot get a hold of her because she has forgotten to charge her phone.  They currently do not have a land line phone  There have been several instances where he checks in with her by phone and she seems confused.  He is not sure if she is actually taking her medicines or not.  He is going to check that when he gets home today.  He has been relying on her to take her medicines.  The memory issue has just been within the last year.  He has some neighbors that may can help out but unfortunately he has no other family close by that can help look after her.  He has questions about nurses aide in assisted living and other resources.  She has a brother in [place],  but no other close by relative.   [name] has a sister but she is not involved.   No chest pain, no sob, no edema.  Has been on thyroid medicaiton in remote past.   No headaches, no palpations .  Appetite not the best but does eat regular meals   No other aggravating or relieving factors.    No other c/o.   Past Medical History:  Diagnosis Date  . Depression   . Hyperlipidemia   . Hypertension   . Hypothyroidism    prior medication per patient as of 03/2020, but none in a while  . Memory change     The following portions of the patient's history were reviewed and updated as appropriate: allergies, current medications, past family history, past medical history, past social history, past surgical history and problem list.  ROS Otherwise as in subjective above    Objective: BP (!) 142/78   Pulse 89   Ht 5' 4.5" (1.638 m)   Wt 104 lb 3.2 oz (47.3 kg)   LMP  (LMP Unknown)   SpO2 99%   BMI 17.61 kg/m    Wt Readings from  Last 3 Encounters:  06/07/20 104 lb 3.2 oz (47.3 kg)  03/28/20 108 lb 3.2 oz (49.1 kg)  02/28/20 106 lb (48.1 kg)   BP Readings from Last 3 Encounters:  06/07/20 (!) 142/78  03/28/20 128/80  02/28/20 140/72     General appearance: alert, no distress, well developed, well nourished Psych: pleasant, good eye contact, answers questions appropriately    Assessment: Encounter Diagnoses  Name Primary?  . Memory change Yes  . Essential hypertension, benign   . Vitamin D deficiency   . Hyperlipidemia, unspecified hyperlipidemia type   . Abnormal thyroid blood test      Plan: Memory concerns- we discussed safety, following up with neurology soon, making sure she has started the Namenda.  We discussed use of calendars, reminders on the phone, clocks, reading regularly to keep engaged.  We discussed personal safety.  Son declines home health evaluation at this time.  We discussed issues or symptoms or problems that would warrant assisted living facility  versus skilled nursing facility placement.  We discussed checking into a nurse aide or some other way to have someone check on mom when he is not in town.    Abnormal thyroid labs, elevated calcium-she will follow-up with endocrinology for new patient consult later this month due to recent lab work including elevated calcium and thyroid labs  I advised that we may need to use a 30-day bubble pack for medicines in the future.  For now use a week to week medicine organizer that son sets up for mom  We discussed home safety, fall prevention, preventing mom from wandering out especially when he is not home, looking into a nurse aide or somebody that can help out especially when son is not in town  Spent > 45 minutes face to face with patient in discussion of symptoms, evaluation, plan and recommendations.    F/ u with specialists as planned soon

## 2020-06-15 ENCOUNTER — Other Ambulatory Visit: Payer: Self-pay

## 2020-06-19 ENCOUNTER — Encounter: Payer: Self-pay | Admitting: Internal Medicine

## 2020-06-19 ENCOUNTER — Ambulatory Visit (INDEPENDENT_AMBULATORY_CARE_PROVIDER_SITE_OTHER): Payer: Medicare HMO | Admitting: Internal Medicine

## 2020-06-19 ENCOUNTER — Other Ambulatory Visit: Payer: Self-pay

## 2020-06-19 VITALS — BP 148/92 | HR 82 | Ht 64.5 in | Wt 110.1 lb

## 2020-06-19 DIAGNOSIS — E0590 Thyrotoxicosis, unspecified without thyrotoxic crisis or storm: Secondary | ICD-10-CM

## 2020-06-19 LAB — TSH: TSH: 0.35 u[IU]/mL (ref 0.35–4.50)

## 2020-06-19 LAB — T4, FREE: Free T4: 0.54 ng/dL — ABNORMAL LOW (ref 0.60–1.60)

## 2020-06-19 NOTE — Progress Notes (Signed)
Name: Gail Knapp  MRN/ DOB: [id number], [birth year]    Age/ Sex: 70 y.o., female    PCP: Jac Canavan, PA-C   Reason for Endocrinology Evaluation: Subclinical Hyperthyroidism     Date of Initial Endocrinology Evaluation: 06/19/2020     HPI: Gail Knapp is a 70 y.o. female with a past medical history of memory loss and Subclinical hyperthyroidism.  The patient presented for initial endocrinology clinic visit on 06/19/2020 for consultative assistance with her Subclinical Hyperthyroidism.      Pt has been noted with low TSH in 03/2019 ast 0.327 uIU/Ml persistent until 03/2020 at 0.208 uIU/mL with noral FT4.    She is accompanied by her son Gail Knapp  Weight has been stable  Eats 2 meals a day  Denies diarrhea   Denies palpitations   Denies previous diagnosis of cardiac arrhythmia  No osteoporosis   Denies local neck symptom No biotin use    Maternal Aunt with thyroid disease   HISTORY:  Past Medical History:  Past Medical History:  Diagnosis Date  . Depression   . Hyperlipidemia   . Hypertension   . Hypothyroidism    prior medication per patient as of 03/2020, but none in a while  . Memory change    Past Surgical History:  Past Surgical History:  Procedure Laterality Date  . COLONOSCOPY     prior, but can't recall date      Social History:  reports that she quit smoking about 5 years ago. She smoked 1.00 pack per day. She has never used smokeless tobacco. She reports previous alcohol use. She reports that she does not use drugs.  Family History: family history includes Hypertension in her brother and mother; Other in her mother.   HOME MEDICATIONS: Allergies as of 06/19/2020      Reactions   Latex Rash      Medication List       Accurate as of June 19, 2020  8:17 AM. If you have any questions, ask your nurse or doctor.        diltiazem 240 MG 24 hr capsule Commonly known as: CARDIZEM CD Take 240 mg by mouth daily.    hydrALAZINE 100 MG tablet Commonly known as: APRESOLINE Take 100 mg by mouth 3 (three) times daily.   hydrochlorothiazide 25 MG tablet Commonly known as: HYDRODIURIL Take 25 mg by mouth daily.   lovastatin 20 MG tablet Commonly known as: MEVACOR Take 20 mg by mouth at bedtime.   memantine 10 MG tablet Commonly known as: NAMENDA Take 1 tablet (10 mg total) by mouth 2 (two) times daily. TAKE 1 AT BEDTIME FOR 2 WEEKS, THEN TWICE DAILY   potassium chloride 10 MEQ tablet Commonly known as: KLOR-CON Take 10 mEq by mouth daily.   Vitamin D-3 125 MCG (5000 UT) Tabs Take 1 capsule by mouth daily.         REVIEW OF SYSTEMS: A comprehensive ROS was conducted with the patient and is negative except as per HPI    OBJECTIVE:  VS: BP (!) 148/92   Pulse 82   Ht 5' 4.5" (1.638 m)   Wt 110 lb 2 oz (50 kg)   LMP  (LMP Unknown)   SpO2 98%   BMI 18.61 kg/m    Wt Readings from Last 3 Encounters:  06/19/20 110 lb 2 oz (50 kg)  06/07/20 104 lb 3.2 oz (47.3 kg)  03/28/20 108 lb 3.2 oz (49.1 kg)  EXAM: General: Pt appears well and is in NAD  Eyes: External eye exam normal without stare, lid lag or exophthalmos.  EOM intact.   Neck: General: Supple without adenopathy. Thyroid: Thyroid size normal.  No goiter or nodules appreciated.   Lungs: Clear with good BS bilat with no rales, rhonchi, or wheezes  Heart: Auscultation: RRR.  Abdomen: Normoactive bowel sounds, soft, nontender, without masses or organomegaly palpable  Extremities: Gait and station: Normal gait   BL LE: No pretibial edema normal ROM and strength.  Skin: Hair: Texture and amount normal with gender appropriate distribution Skin Inspection: No rashes Skin Palpation: Skin temperature, texture, and thickness normal to palpation  Neuro: Cranial nerves: II - XII grossly intact  Motor: Normal strength throughout DTRs: 2+ and symmetric in UE without delay in relaxation phase  Mental Status: Judgment, insight:  Intact Memory: Intact for recent and remote events Mood and affect: No depression, anxiety, or agitation     DATA REVIEWED:   Results for Gail Knapp, Gail Knapp (MRN [id number]) as of 06/20/2020 12:16  Ref. Range 06/19/2020 08:23  TSH Latest Ref Range: 0.35 - 4.50 uIU/mL 0.35  Triiodothyronine (T3) Latest Ref Range: 76 - 181 ng/dL 308  M5,HQIO(NGEXBM) Latest Ref Range: 0.60 - 1.60 ng/dL 8.41 (L)   Results for Gail Knapp, Gail Knapp (MRN [id number]) as of 06/19/2020 07:36  Ref. Range 03/28/2020 10:53  Sodium Latest Ref Range: 134 - 144 mmol/L 142  Potassium Latest Ref Range: 3.5 - 5.2 mmol/L 4.3  Chloride Latest Ref Range: 96 - 106 mmol/L 105  CO2 Latest Ref Range: 20 - 29 mmol/L 25  Glucose Latest Ref Range: 65 - 99 mg/dL 78  BUN Latest Ref Range: 8 - 27 mg/dL 11  Creatinine Latest Ref Range: 0.57 - 1.00 mg/dL 2.53  Calcium Latest Ref Range: 8.7 - 10.3 mg/dL 66.4 (H)  BUN/Creatinine Ratio Latest Ref Range: 12 - 28  12  Alkaline Phosphatase Latest Ref Range: 44 - 121 IU/L 133 (H)  Albumin Latest Ref Range: 3.8 - 4.8 g/dL 4.5  Albumin/Globulin Ratio Latest Ref Range: 1.2 - 2.2  2.1  AST Latest Ref Range: 0 - 40 IU/L 18  ALT Latest Ref Range: 0 - 32 IU/L 10  Total Protein Latest Ref Range: 6.0 - 8.5 g/dL 6.6  Total Bilirubin Latest Ref Range: 0.0 - 1.2 mg/dL 0.5  GFR, Est Non African American Latest Ref Range: >59 mL/min/1.73 67  GFR, Est African American Latest Ref Range: >59 mL/min/1.73 77  Total CHOL/HDL Ratio Latest Ref Range: 0.0 - 4.4 ratio 2.3  Cholesterol, Total Latest Ref Range: 100 - 199 mg/dL 403 (H)  HDL Cholesterol Latest Ref Range: >39 mg/dL 474  Triglycerides Latest Ref Range: 0 - 149 mg/dL 70  VLDL Cholesterol Cal Latest Ref Range: 5 - 40 mg/dL 12  LDL Chol Calc (NIH) Latest Ref Range: 0 - 99 mg/dL 259 (H)  Globulin, Total Latest Ref Range: 1.5 - 4.5 g/dL 2.1  WBC Latest Ref Range: 3.4 - 10.8 x10E3/uL 5.7  RBC Latest Ref Range: 3.77 - 5.28 x10E6/uL 4.33  Hemoglobin Latest Ref  Range: 11.1 - 15.9 g/dL 56.3  HCT Latest Ref Range: 34.0 - 46.6 % 38.8  MCV Latest Ref Range: 79 - 97 fL 90  MCH Latest Ref Range: 26.6 - 33.0 pg 29.1  MCHC Latest Ref Range: 31.5 - 35.7 g/dL 87.5  RDW Latest Ref Range: 11.7 - 15.4 % 11.7  Platelets Latest Ref Range: 150 - 450 x10E3/uL 167  Neutrophils Latest Ref  Range: Not Estab. % 76  Immature Granulocytes Latest Ref Range: Not Estab. % 0  NEUT# Latest Ref Range: 1.4 - 7.0 x10E3/uL 4.2  Lymphocyte # Latest Ref Range: 0.7 - 3.1 x10E3/uL 1.0  Monocytes Absolute Latest Ref Range: 0.1 - 0.9 x10E3/uL 0.3  Basophils Absolute Latest Ref Range: 0.0 - 0.2 x10E3/uL 0.0  Immature Grans (Abs) Latest Ref Range: 0.0 - 0.1 x10E3/uL 0.0  Lymphs Latest Ref Range: Not Estab. % 18  Monocytes Latest Ref Range: Not Estab. % 6  Basos Latest Ref Range: Not Estab. % 0  Eos Latest Ref Range: Not Estab. % 0  EOS (ABSOLUTE) Latest Ref Range: 0.0 - 0.4 x10E3/uL 0.0  TSH Latest Ref Range: 0.450 - 4.500 uIU/mL 0.208 (L)  T4,Free(Direct) Latest Ref Range: 0.82 - 1.77 ng/dL 5.95   ASSESSMENT/PLAN/RECOMMENDATIONS:   1. Subclinical Hyperthyroidism:   - Pt is clinically euthyroid  - No local neck symptoms - D/D includes graves' disease vs autonomous thyroid nodule(s)  - Repeat testing shows normalization of TSH, FT4 is low but no intervention is needed at this time, will continue to monitor - TRAb pending     Medications : N/A    F/U in 4 months    Addendum: discussed labs with son Gail Knapp on 06/20/2020 at 1215 Signed electronically by: Lyndle Herrlich, MD  [facility] Endocrinology  [facility] [address] [place], [place] [address] Phone: [phone] FAX: [phone]   CC: Jac Canavan, PA-C [address] [place] [address] Phone: [phone] Fax: [phone]   Return to Endocrinology clinic as below: Future Appointments  Date Time Provider Department Center  06/21/2020  1:30 PM Suanne Marker, MD [facility] None  07/14/2020  9:00 AM Tysinger, Kermit Balo, PA-C [facility]

## 2020-06-19 NOTE — Patient Instructions (Signed)
-   Please stop by the lab today for a thyroid check. If your thyroid is still off, will proceed with thyroid uptake and scan     Thyroid Uptake and Scan works like this: We would first check a thyroid "scan" (a special, but easy and painless type of thyroid x ray).  you go to the x-ray department of the hospital to swallow a pill, which contains a miniscule amount of radiation.  You will not notice any symptoms from this.  You will go back to the x-ray department the next day, to lie down in front of a camera.  The results of this will be sent to me.

## 2020-06-21 ENCOUNTER — Ambulatory Visit: Payer: Medicare HMO | Admitting: Diagnostic Neuroimaging

## 2020-06-21 ENCOUNTER — Other Ambulatory Visit: Payer: Self-pay

## 2020-06-21 ENCOUNTER — Encounter: Payer: Self-pay | Admitting: Diagnostic Neuroimaging

## 2020-06-21 VITALS — BP 151/75 | HR 88 | Ht 64.5 in | Wt 110.4 lb

## 2020-06-21 DIAGNOSIS — F03B18 Unspecified dementia, moderate, with other behavioral disturbance: Secondary | ICD-10-CM

## 2020-06-21 DIAGNOSIS — F0391 Unspecified dementia with behavioral disturbance: Secondary | ICD-10-CM | POA: Diagnosis not present

## 2020-06-21 LAB — T3: T3, Total: 106 ng/dL (ref 76–181)

## 2020-06-21 LAB — TRAB (TSH RECEPTOR BINDING ANTIBODY): TRAB: <1 IU/L (ref <=2.00)

## 2020-06-21 NOTE — Progress Notes (Signed)
[facility]  PATIENT: Gail Knapp DOB: 03-08-1951  REFERRING CLINICIAN: Aleen Knapp Gail Balo, PA-C HISTORY FROM: patient and son REASON FOR VISIT: follow up   HISTORICAL  CHIEF COMPLAINT:  Chief Complaint  Patient presents with  . Dementia    Rm 7, FU sonThereasa Knapp  MMSE 15  "FU per PCP, Namenda started by PCP"    HISTORY OF PRESENT ILLNESS:   UPDATE (06/21/20, VRP): Since last visit, some progression of memory loss and confusion. Was very Now living with son since past 2 years. Had more confusion when son went out of town on business trip.   UPDATE (02/28/20, VRP): Since last visit, some progression of memory loss issues. Son at the visit here today. Has had some events of forgetting where she parked car when remote battery failed; also has lost debit card twice. Still working and driving. No alleviating or aggravating factors. Tolerating meds.   UPDATE (07/12/19, VRP): Since last visit, doing about the same. Symptoms are stable. Severity is mild. No alleviating or aggravating factors. Still able to work at [employer].  NO change in ADLs.   PRIOR HPI (04/14/19): 70 year old female here for evaluation memory loss.  Patient reports subjective mild short-term memory loss and forgetfulness for past 4 to 5 months.  This has been noticed by patient and her son.  No change in ADLs.  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   She was living in [place] [place] [facility] [place] at the time and then moved to [place] to help her daughter with her family.  3 months ago patient decided to move in with son, as it was too stressful living with daughter.  Also in the past 3 months patient has gone back to work at [employer].    Patient still able to take care of personal affairs, hygiene, bathing, dressing, household chores, driving, shopping, going to work.  No change in ADLs.  Patient's mother had some memory problem,  possible dementia.  No issues with sleep, stress, anxiety or depression.   REVIEW OF SYSTEMS: Full 14 system review of systems performed and negative with exception of: As per HPI.  ALLERGIES: Allergies  Allergen Reactions  . Latex Rash    HOME MEDICATIONS: Outpatient Medications Prior to Visit  Medication Sig Dispense Refill  . Cholecalciferol (VITAMIN D-3) 125 MCG (5000 UT) TABS Take 1 capsule by mouth daily. 30 tablet 2  . diltiazem (CARDIZEM CD) 240 MG 24 hr capsule Take 240 mg by mouth daily.    . hydrALAZINE (APRESOLINE) 100 MG tablet Take 100 mg by mouth 3 (three) times daily.    . hydrochlorothiazide (HYDRODIURIL) 25 MG tablet Take 25 mg by mouth daily.    Marland Kitchen lovastatin (MEVACOR) 20 MG tablet Take 20 mg by mouth at bedtime.    . memantine (NAMENDA) 10 MG tablet Take 1 tablet (10 mg total) by mouth 2 (two) times daily. TAKE 1 AT BEDTIME FOR 2 WEEKS, THEN TWICE DAILY 180 tablet 1  . potassium chloride (KLOR-CON) 10 MEQ tablet Take 10 mEq by mouth daily.     No facility-administered medications prior to visit.    PAST MEDICAL HISTORY: Past Medical History:  Diagnosis Date  . Depression   . Hyperlipidemia   . Hypertension   . Hypothyroidism    prior medication per patient as of 03/2020, but none in a while  . Memory change     PAST SURGICAL HISTORY: Past Surgical  History:  Procedure Laterality Date  . COLONOSCOPY     prior, but can't recall date    FAMILY HISTORY: Family History  Problem Relation Age of Onset  . Hypertension Mother   . Other Mother        "memory loss"  . Hypertension Brother     SOCIAL HISTORY: Social History   Socioeconomic History  . Marital status: Divorced    Spouse name: Not on file  . Number of children: 2  . Years of education: 44  . Highest education level: High school graduate  Occupational History    Comment: [employer] part time  Tobacco Use  . Smoking status: Former Smoker    Packs/day: 1.00    Quit date: 04/14/2015     Years since quitting: 5.1  . Smokeless tobacco: Never Used  Substance and Sexual Activity  . Alcohol use: Not Currently  . Drug use: Never  . Sexual activity: Not on file  Other Topics Concern  . Not on file  Social History Narrative   06/21/20 Lives with son, Gail Knapp 02/28/20. Caffeine-none.   Works part time [employer].    Exercise - some with walking . 03/2020   Social Determinants of Health   Financial Resource Strain: Not on file  Food Insecurity: Not on file  Transportation Needs: Not on file  Physical Activity: Not on file  Stress: Not on file  Social Connections: Not on file  Intimate Partner Violence: Not on file     PHYSICAL EXAM  GENERAL EXAM/CONSTITUTIONAL: Vitals:  Vitals:   06/21/20 1253  BP: (!) 151/75  Pulse: 88  Weight: 110 lb 6.4 oz (50.1 kg)  Height: 5' 4.5" (1.638 m)   Body mass index is 18.66 kg/m. Wt Readings from Last 3 Encounters:  06/21/20 110 lb 6.4 oz (50.1 kg)  06/19/20 110 lb 2 oz (50 kg)  06/07/20 104 lb 3.2 oz (47.3 kg)    Patient is in no distress; well developed, nourished and groomed; neck is supple  CARDIOVASCULAR:  Examination of carotid arteries is normal; no carotid bruits  Regular rate and rhythm, no murmurs  Examination of peripheral vascular system by observation and palpation is normal  EYES:  Ophthalmoscopic exam of optic discs and posterior segments is normal; no papilledema or hemorrhages No exam data present  MUSCULOSKELETAL:  Gait, strength, tone, movements noted in Neurologic exam below  NEUROLOGIC: MENTAL STATUS:  MMSE - Mini Mental State Exam 06/21/2020 06/07/2020 02/28/2020  Orientation to time 0 0 1  Orientation to Place 4 5 4   Registration 3 3 3   Attention/ Calculation 0 3 0  Recall 2 0 1  Language- name 2 objects 2 2 2   Language- repeat 1 1 1   Language- follow 3 step command 2 3 3   Language- follow 3 step command-comments used left hand - -  Language- read & follow direction 1 1 1   Write a sentence 0 0  1  Write a sentence-comments no subject - -  Copy design 0 0 0  Total score 15 18 17     awake, alert, oriented to person, place  DECR memory   DECR attention and concentration  language fluent, comprehension intact, naming intact  fund of knowledge appropriate  CRANIAL NERVE:   2nd - no papilledema on fundoscopic exam  2nd, 3rd, 4th, 6th - pupils equal and reactive to light, visual fields full to confrontation, extraocular muscles intact, no nystagmus  5th - facial sensation symmetric  7th - facial strength symmetric  8th -  hearing intact  9th - palate elevates symmetrically, uvula midline  11th - shoulder shrug symmetric  12th - tongue protrusion midline  MOTOR:   normal bulk and tone, full strength in the BUE, BLE  SENSORY:   normal and symmetric to light touch, temperature, vibration  COORDINATION:   finger-nose-finger, fine finger movements normal  REFLEXES:   deep tendon reflexes present and symmetric  GAIT/STATION:   narrow based gait     DIAGNOSTIC DATA (LABS, IMAGING, TESTING) - I reviewed patient records, labs, notes, testing and imaging myself where available.  Lab Results  Component Value Date   WBC 5.7 03/28/2020   HGB 12.6 03/28/2020   HCT 38.8 03/28/2020   MCV 90 03/28/2020   PLT 167 03/28/2020      Component Value Date/Time   NA 142 03/28/2020 1053   K 4.3 03/28/2020 1053   CL 105 03/28/2020 1053   CO2 25 03/28/2020 1053   GLUCOSE 78 03/28/2020 1053   BUN 11 03/28/2020 1053   CREATININE 0.89 03/28/2020 1053   CALCIUM 10.6 (H) 03/28/2020 1053   PROT 6.6 03/28/2020 1053   ALBUMIN 4.5 03/28/2020 1053   AST 18 03/28/2020 1053   ALT 10 03/28/2020 1053   ALKPHOS 133 (H) 03/28/2020 1053   BILITOT 0.5 03/28/2020 1053   GFRNONAA 67 03/28/2020 1053   GFRAA 77 03/28/2020 1053   Lab Results  Component Value Date   CHOL 233 (H) 03/28/2020   HDL 100 03/28/2020   LDLCALC 121 (H) 03/28/2020   TRIG 70 03/28/2020   CHOLHDL 2.3  03/28/2020   No results found for: HGBA1C Lab Results  Component Value Date   VITAMINB12 391 04/14/2019   Lab Results  Component Value Date   TSH 0.35 06/19/2020    06/23/19 MRI brain (with and without): -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 -Mild scattered foci of nonspecific lesions. -No acute findings.   ASSESSMENT AND PLAN  70 y.o. year old female here with mild memory loss, some changes in ADLs, with unexpectedly low MMSE given her history of high school graduation and working career.  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   Dx:  1. Moderate dementia with behavioral disturbance (HCC)      PLAN:  MEMORY LOSS (MMSE 15/30; moderate dementia) - continue memantine 10mg  twice a day - safety / supervision issues reviewed - daily physical activity / exercise (at least 15-30 minutes) - eat more plants / vegetables - increase social activities, brain stimulation, games, puzzles, hobbies, crafts, arts, music - aim for at least 7-8 hours sleep per night (or more) - avoid smoking and alcohol - caregiver resources provided - no driving; needs help with finances and medications;  - follow up PCP  Return for pending if symptoms worsen or fail to improve.    , MD 06/21/2020, 1:14 PM Certified in Neurology, Neurophysiology and Neuroimaging  [facility] [address] [place], [place] [phone]

## 2020-06-21 NOTE — Patient Instructions (Signed)
  MEMORY LOSS (MMSE 15/30; moderate dementia) - continue memantine 10mg  twice a day - safety / supervision issues reviewed - daily physical activity / exercise (at least 15-30 minutes) - eat more plants / vegetables - increase social activities, brain stimulation, games, puzzles, hobbies, crafts, arts, music - aim for at least 7-8 hours sleep per night (or more) - avoid smoking and alcohol - caregiver resources provided - no driving; needs help with finances and medications;  - follow up PCP

## 2020-06-26 ENCOUNTER — Telehealth: Payer: Self-pay | Admitting: Internal Medicine

## 2020-06-26 NOTE — Telephone Encounter (Signed)
I see where they saw neurology recently.  Has he notified the neurologist of the symptoms as well?  Did he go visit nursing homes like we discussed?  If he is having to be out of town and has no one to look after her, then he probably should either get a full-time nurse aide when he is not there or unfortunately he may have to go ahead and establish her with a nursing facility if she is wandering off.   We can help process placing her regarding an order ,but he needs to choose a place that has openings and will accept her.    See if he interviewed any skilled nursing facilities?

## 2020-06-26 NOTE — Telephone Encounter (Signed)
Pt's son called and states that he would like his mom to be evaluated to have 24 hour care and possible go to assisted living. She walked off over the weekend and they were able to catch her but she is not remembering a lot of whats going on or who people are. Please advise and place evaluation referral

## 2020-06-26 NOTE — Telephone Encounter (Signed)
Patient son stated he was waiting for an assessment to be done. He would like an order for someone to come out and assess and see what assistance she is eligible for that her insurance will cover. He not looked into any skilled nursing facilities but is really worried at this point as patient has been wondering off.

## 2020-06-27 ENCOUNTER — Other Ambulatory Visit: Payer: Self-pay

## 2020-06-27 DIAGNOSIS — R413 Other amnesia: Secondary | ICD-10-CM

## 2020-06-27 DIAGNOSIS — G3184 Mild cognitive impairment, so stated: Secondary | ICD-10-CM

## 2020-06-27 NOTE — Telephone Encounter (Signed)
Patient has been referred to [facility].

## 2020-06-27 NOTE — Telephone Encounter (Signed)
Last visit I asked him to go ahead and research some nursing homes in the case this time comes  Please send out home health for home psych eval to assess need for patient  Double check that neurology has not already ordered this.  Ask son

## 2020-06-30 ENCOUNTER — Telehealth: Payer: Self-pay

## 2020-06-30 NOTE — Telephone Encounter (Signed)
Pt. Son called wanting to know if he could get a different referral to have her get an evaluation for memory loss and dementia to get her into a long term care facility. He received a call finally from [facility] they said due to staffing issues they wouldn't be able to see her.

## 2020-07-03 NOTE — Telephone Encounter (Signed)
Gail Knapp has been informed that we are waiting to hear back from representative from [facility] to let us know if [facility] will be abe to assist patient. We are aware that [facility] does not accept patient policy.

## 2020-07-07 ENCOUNTER — Telehealth: Payer: Self-pay

## 2020-07-07 NOTE — Telephone Encounter (Signed)
Minda with [facility] called wanting to get a verbal order for speech therapy twice a week for 4 weeks and once a week for 2 weeks. They also wanted to know if the could get a verbal order for a social worker eval. Can LM on [phone].

## 2020-07-10 NOTE — Telephone Encounter (Signed)
Please give the okay for both of those requests

## 2020-07-11 NOTE — Telephone Encounter (Signed)
Left message given verbal orders on the machine.

## 2020-07-14 ENCOUNTER — Encounter: Payer: Self-pay | Admitting: Medical

## 2020-07-14 ENCOUNTER — Other Ambulatory Visit: Payer: Self-pay | Admitting: Medical

## 2020-07-14 ENCOUNTER — Other Ambulatory Visit: Payer: Self-pay

## 2020-07-14 ENCOUNTER — Ambulatory Visit (INDEPENDENT_AMBULATORY_CARE_PROVIDER_SITE_OTHER): Payer: Medicare HMO | Admitting: Medical

## 2020-07-14 VITALS — BP 140/82 | HR 85 | Ht 65.0 in | Wt 110.4 lb

## 2020-07-14 DIAGNOSIS — Z1231 Encounter for screening mammogram for malignant neoplasm of breast: Secondary | ICD-10-CM

## 2020-07-14 DIAGNOSIS — B351 Tinea unguium: Secondary | ICD-10-CM

## 2020-07-14 DIAGNOSIS — R7989 Other specified abnormal findings of blood chemistry: Secondary | ICD-10-CM

## 2020-07-14 DIAGNOSIS — Z0000 Encounter for general adult medical examination without abnormal findings: Secondary | ICD-10-CM | POA: Diagnosis not present

## 2020-07-14 DIAGNOSIS — Z7185 Encounter for immunization safety counseling: Secondary | ICD-10-CM

## 2020-07-14 DIAGNOSIS — E785 Hyperlipidemia, unspecified: Secondary | ICD-10-CM | POA: Diagnosis not present

## 2020-07-14 DIAGNOSIS — G3184 Mild cognitive impairment, so stated: Secondary | ICD-10-CM

## 2020-07-14 DIAGNOSIS — Z1159 Encounter for screening for other viral diseases: Secondary | ICD-10-CM

## 2020-07-14 DIAGNOSIS — E2839 Other primary ovarian failure: Secondary | ICD-10-CM

## 2020-07-14 DIAGNOSIS — I10 Essential (primary) hypertension: Secondary | ICD-10-CM | POA: Diagnosis not present

## 2020-07-14 DIAGNOSIS — Z136 Encounter for screening for cardiovascular disorders: Secondary | ICD-10-CM

## 2020-07-14 DIAGNOSIS — R748 Abnormal levels of other serum enzymes: Secondary | ICD-10-CM

## 2020-07-14 DIAGNOSIS — Z7189 Other specified counseling: Secondary | ICD-10-CM

## 2020-07-14 DIAGNOSIS — E559 Vitamin D deficiency, unspecified: Secondary | ICD-10-CM

## 2020-07-14 DIAGNOSIS — Z23 Encounter for immunization: Secondary | ICD-10-CM | POA: Diagnosis not present

## 2020-07-14 DIAGNOSIS — Q845 Enlarged and hypertrophic nails: Secondary | ICD-10-CM

## 2020-07-14 DIAGNOSIS — R413 Other amnesia: Secondary | ICD-10-CM

## 2020-07-14 DIAGNOSIS — Z1211 Encounter for screening for malignant neoplasm of colon: Secondary | ICD-10-CM

## 2020-07-14 LAB — POCT URINALYSIS DIP (PROADVANTAGE DEVICE)
Bilirubin, UA: NEGATIVE
Blood, UA: NEGATIVE
Glucose, UA: NEGATIVE mg/dL
Ketones, POC UA: NEGATIVE mg/dL
Leukocytes, UA: NEGATIVE
Nitrite, UA: NEGATIVE
Protein Ur, POC: NEGATIVE mg/dL
Specific Gravity, Urine: 1.015
Urobilinogen, Ur: 0.2
pH, UA: 8 (ref 5.0–8.0)

## 2020-07-14 NOTE — Patient Instructions (Signed)
Today you had a preventative care visit or wellness visit.    Topics today may have included healthy lifestyle, diet, exercise, preventative care, vaccinations, sick and well care, proper use of emergency dept and after hours care, as well as other concerns.     Recommendations: Continue to return yearly for your annual wellness and preventative care visits.  This gives [place] a chance to discuss healthy lifestyle, exercise, vaccinations, review your chart record, and perform screenings where appropriate.  I recommend you see your eye doctor yearly for routine vision care.  I recommend you see your dentist yearly for routine dental care including hygiene visits twice yearly.   Vaccination recommendations were reviewed  You are up to date on Covid and flu vaccines  I recommend a tetanus vaccine booster I recommend the Prevnar 13 pneumonia vaccine I recommend the Shingrix shingles vaccine  Counseled on the pneumococcal vaccine.  Vaccine information sheet given.  Pneumococcal vaccine Prevnar 13 given after consent obtained.  Shingles vaccine:  I recommend you have a shingles vaccine to help prevent shingles or herpes zoster outbreak.   Please call your insurer to inquire about coverage for the Shingrix vaccine given in 2 doses.   Some insurers cover this vaccine after age 98, some cover this after age 72.  If your insurer covers this, then call to schedule appointment to have this vaccine here.  Please insurance above coverage for the tetanus booster as well.    Screening for cancer: Breast cancer screening: You should perform a self breast exam monthly.   We reviewed recommendations for regular mammograms and breast cancer screening.  Please call to schedule your mammogram and bone density test   [facility]  [phone] [address], [address] [place], [place] [address]   Colon cancer screening:  You may do either Cologard or Colonoscopy as long as  labs do not show anemia.    Call insurance to check coverage for Cologard and Colonoscopy   Cervical cancer screening: We reviewed recommendations for pap smear screening.  Skin cancer screening: Check your skin regularly for new changes, growing lesions, or other lesions of concern Come in for evaluation if you have skin lesions of concern.  Lung cancer screening: If you have a greater than 30 pack year history of tobacco use, then you qualify for lung cancer screening with a chest CT scan  We currently don't have screenings for other cancers besides breast, cervical, colon, and lung cancers.  If you have a strong family history of cancer or have other cancer screening concerns, please let me know.    Bone health: Get at least 150 minutes of aerobic exercise weekly Get weight bearing exercise at least once weekly  Please call to schedule your mammogram and bone density test.   [facility]  [phone] [address] [place], [place] [address]    Heart health: Get at least 150 minutes of aerobic exercise weekly Limit alcohol It is important to maintain a healthy blood pressure and healthy cholesterol numbers  We reviewed your baseline EKG today, heart screen    Separate significant issues discussed: Vitamin D deficiency-labs today, continue current medication  Memory change, impaired ADLs, requiring 24/7 supervision-I reviewed her recent neurology consult notes. She continues on Namenda. Her son is primarily handing her care. He has recently got her in with the pace program locally that gives her an opportunity to do daytime activities from 8- 5, so  she has supervision now and  not the safety issues they are having with her leaving the house a few months ago. She a much better handle on her safety supervision now. Speech therapy is coming out of the house 2 times per week currently to help with organizational  skills  Hyperlipidemia-continue statin, recheck labs today  Hypertension-continue current medication  Elevated alkaline phosphatase-recheck labs today, likely due to vitamin D deficiency  Abnormal thyroid labs a few months ago-I reviewed her recent endocrinology note and at that time her thyroid was euthyroid. I will see her back in May 2022  Estrogen deficiency, postmenopausal-she will go for bone density test  Onychomycosis and hypertrophic nails-referral to podiatry, consider Lamisil oral. We did discuss risk and benefits of medication

## 2020-07-14 NOTE — Progress Notes (Signed)
Referral and order done.

## 2020-07-14 NOTE — Progress Notes (Signed)
Subjective:    Gail Knapp is a 70 y.o. female who presents for Preventative Services visit and chronic medical problems/med check visit.    Primary Care Provider [place], Kermit Baloavid S, PA-C here for primary care  Current Health Care Team:  Dr. Joycelyn SchmidVikram Penumalli, neurology  Dr. Terrace ArabiaIbtehal Shamleffer, endocrinology  Medical Services you may have received from other than [facility] providers in the past year (date may be approximate) None  Exercise Current exercise habits: The patient does not participate in regular exercise at present.   Nutrition/Diet Current diet: in general, an "unhealthy" diet  Depression Screen [facility] 07/[facility]/2020  Decreased Interest 0  Down, Depressed, Hopeless 0  PHQ - 2 Score 0    Activities of Daily Living Screen/Functional Status Survey Is the patient deaf or have difficulty hearing?: No Does the patient have difficulty seeing, even when wearing glasses/contacts?: No Does the patient have difficulty concentrating, remembering, or making decisions?: Yes Does the patient have difficulty walking or climbing stairs?: No Does the patient have difficulty dressing or bathing?: No Does the patient have difficulty doing errands alone such as visiting a doctor's office or shopping?: Yes  Can patient draw a clock face showing 3:00 oclock, No  Fall Risk Screen Fall Risk  07/14/2020 06/21/2020 03/28/2020 07/12/2019 04/14/2019  Falls in the past year? 0 0 0 0 0  Number falls in past yr: - - 0 - -  Injury with Fall? - - 0 - -  Risk for fall due to : No Fall Risks - - - -  Follow up Falls evaluation completed - - - -    Gait Assessment: Normal gait observed -yes  Advanced directives Does patient have a Health Care Power of Attorney? Yes Does patient have a Living Will? No  Past Medical History:  Diagnosis Date  . Depression   . Hyperlipidemia   . Hypertension   . Hypothyroidism    prior medication per patient as of 03/2020, but none in a while   . Memory change   . Memory change 2021  . Requires continuous supervision for activities of daily living (ADL) 2022  . Vitamin D deficiency     Past Surgical History:  Procedure Laterality Date  . COLONOSCOPY     prior, but can't recall date    Social History   Socioeconomic History  . Marital status: Divorced    Spouse name: Not on file  . Number of children: 2  . Years of education: 1412  . Highest education level: High school graduate  Occupational History    Comment: [employer] part time  Tobacco Use  . Smoking status: Former Smoker    Packs/day: 1.00    Quit date: 04/14/2015    Years since quitting: 5.2  . Smokeless tobacco: Never Used  Substance and Sexual Activity  . Alcohol use: Not Currently  . Drug use: Never  . Sexual activity: Not on file  Other Topics Concern  . Not on file  Social History Narrative   06/21/20 Lives with son, Thereasa DistanceRodney 02/28/20. Caffeine-none.   Use to work part time [employer].  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   Exercise - some with walking . 06/2020   Social Determinants of Health   Financial Resource Strain: Not on file  Food Insecurity: Not on file  Transportation Needs: Not on file  Physical Activity: Not on file  Stress: Not on file  Social Connections: Not on file  Intimate Partner Violence:  Not on file    Family History  Problem Relation Age of Onset  . Hypertension Mother   . Other Mother        "memory loss"  . Hypertension Brother      Current Outpatient Medications:  .  diltiazem (CARDIZEM CD) 240 MG 24 hr capsule, Take 240 mg by mouth daily., Disp: , Rfl:  .  hydrALAZINE (APRESOLINE) 100 MG tablet, Take 100 mg by mouth 3 (three) times daily., Disp: , Rfl:  .  hydrochlorothiazide (HYDRODIURIL) 25 MG tablet, Take 25 mg by mouth daily., Disp: , Rfl:  .  lovastatin (MEVACOR) 20 MG tablet, Take 20 mg by mouth at bedtime., Disp: , Rfl:  .  memantine (NAMENDA) 10 MG tablet, Take 1 tablet (10 mg  total) by mouth 2 (two) times daily. TAKE 1 AT BEDTIME FOR 2 WEEKS, THEN TWICE DAILY, Disp: 180 tablet, Rfl: 1 .  potassium chloride (KLOR-CON) 10 MEQ tablet, Take 10 mEq by mouth daily., Disp: , Rfl:  .  Cholecalciferol (VITAMIN D-3) 125 MCG (5000 UT) TABS, Take 1 capsule by mouth daily. (Patient not taking: Reported on 07/14/2020), Disp: 30 tablet, Rfl: 2  Allergies  Allergen Reactions  . Latex Rash    History reviewed: allergies, current medications, past family history, past medical history, past social history, past surgical history and problem list  Chronic issues discussed: Hypertension-compliant with medication  Hyperlipidemia-compliant with medication  Vitamin D deficiency-taking vitamin D  Memory loss - recently saw neurology   Acute issues discussed: Thickened nails on left  Objective:      Biometrics BP 140/82   Pulse 85   Ht  (1.651 m)   Wt 110 lb 6.4 oz (50.1 kg)   LMP  (LMP Unknown)   SpO2 95%   BMI 18.37 kg/m   BP Readings from Last 3 Encounters:  07/14/20 140/82  06/21/20 (!) 151/75  06/19/20 (!) 148/92   Wt Readings from Last 3 Encounters:  07/14/20 110 lb 6.4 oz (50.1 kg)  06/21/20 110 lb 6.4 oz (50.1 kg)  06/19/20 110 lb 2 oz (50 kg)    Cognitive Testing +decreased cognition  General appearance: alert, no distress, WD/WN, African American female  Nutritional Status: Inadequate calore intake? no Loss of muscle mass? no Loss of fat beneath skin? no Localized or general edema? no Diminished functional status? yes  Other pertinent exam: HEENT: normocephalic, sclerae anicteric, TMs pearly, nares patent, no discharge or erythema, pharynx normal Oral cavity: MMM, no lesions Neck: supple, no lymphadenopathy, no thyromegaly, no masses, no bruits Heart: RRR, normal S1, S2, no murmurs Lungs: CTA bilaterally, no wheezes, rhonchi, or rales Abdomen: +bs, soft, non tender, non distended, no masses, no hepatomegaly, no  splenomegaly Musculoskeletal: nontender, no swelling, no obvious deformity Extremities: no edema, no cyanosis, no clubbing Pulses: 2+ symmetric, upper and lower extremities, normal cap refill Neurological: alert, CN2-12 intact, strength normal upper extremities and lower extremities, sensation normal throughout, DTRs 2+ throughout, no cerebellar signs, gait normal Psychiatric: normal affect, behavior normal, pleasant  Foot: right foot with thickened hypertrophic toenails throughout, fairly normal-looking toenails on the right foot, there is a flat brown macule on the right lateral foot anterior to the ankle approximately 6 mm x 4 mm, there is a similar brown macule on the bottom of the left volar foot midfoot approximately 3 mm diameter  EKG  indication screen for heart disease, rate 76 bpm, PR 140 ms, QRS 70 ms, QTC 414 ms, axis 31 degrees, normal sinus  rhythm, minimal voltage for LVH    Assessment:   Encounter Diagnoses  Name Primary?  . Encounter for health maintenance examination in adult Yes  . Medicare annual wellness visit, initial   . Vitamin D deficiency   . Memory change   . Mild cognitive impairment with memory loss   . Hyperlipidemia, unspecified hyperlipidemia type   . Estrogen deficiency   . Essential hypertension, benign   . Advanced directives, counseling/discussion   . Abnormal thyroid blood test   . Vaccine counseling   . Encounter for screening mammogram for malignant neoplasm of breast   . Screen for colon cancer   . Encounter for hepatitis C screening test for low risk patient   . Screening for heart disease   . Onychomycosis   . Enlarged and hypertrophic nails   . Elevated alkaline phosphatase level      Plan:   A preventative services visit was completed today.  During the course of the visit today, we discussed and counseled about appropriate screening and preventive services.  A health risk assessment was established today that included a review of  current medications, allergies, social history, family history, medical and preventative health history, biometrics, and preventative screenings to identify potential safety concerns or impairments.  Today you had a preventative care visit or wellness visit.    Topics today may have included healthy lifestyle, diet, exercise, preventative care, vaccinations, sick and well care, proper use of emergency dept and after hours care, as well as other concerns.     Recommendations: Continue to return yearly for your annual wellness and preventative care visits.  This gives [place] a chance to discuss healthy lifestyle, exercise, vaccinations, review your chart record, and perform screenings where appropriate.  I recommend you see your eye doctor yearly for routine vision care.  I recommend you see your dentist yearly for routine dental care including hygiene visits twice yearly.   Vaccination recommendations were reviewed  You are up to date on Covid and flu vaccines  I recommend a tetanus vaccine booster I recommend the Prevnar 13 pneumonia vaccine I recommend the Shingrix shingles vaccine  Counseled on the pneumococcal vaccine.  Vaccine information sheet given.  Pneumococcal vaccine Prevnar 13 given after consent obtained.  Shingles vaccine:  I recommend you have a shingles vaccine to help prevent shingles or herpes zoster outbreak.   Please call your insurer to inquire about coverage for the Shingrix vaccine given in 2 doses.   Some insurers cover this vaccine after age 64, some cover this after age 89.  If your insurer covers this, then call to schedule appointment to have this vaccine here.  Please insurance above coverage for the tetanus booster as well.    Screening for cancer: Breast cancer screening: You should perform a self breast exam monthly.   We reviewed recommendations for regular mammograms and breast cancer screening.  Please call to schedule your mammogram and bone density  test   [facility]  [phone] [address] [place], [place] [address]   Colon cancer screening:  You may do either Cologard or Colonoscopy as long as labs do not show anemia.    Call insurance to check coverage for Cologard and Colonoscopy   Cervical cancer screening: We reviewed recommendations for pap smear screening.  Skin cancer screening: Check your skin regularly for new changes, growing lesions, or other lesions of concern Come in for evaluation if you have skin lesions of concern.  Lung cancer screening: If you have a greater than 30 pack year history of tobacco use, then you qualify for lung cancer screening with a chest CT scan  We currently don't have screenings for other cancers besides breast, cervical, colon, and lung cancers.  If you have a strong family history of cancer or have other cancer screening concerns, please let me know.    Bone health: Get at least 150 minutes of aerobic exercise weekly Get weight bearing exercise at least once weekly  Please call to schedule your mammogram and bone density test.   [facility]  [phone] [address] [place], [place] [address]    Heart health: Get at least 150 minutes of aerobic exercise weekly Limit alcohol It is important to maintain a healthy blood pressure and healthy cholesterol numbers  We reviewed your baseline EKG today, heart screen    Separate significant issues discussed: Vitamin D deficiency-labs today, continue current medication  Memory change, impaired ADLs, requiring 24/7 supervision-I reviewed her recent neurology consult notes. She continues on Namenda. Her son is primarily handing her care. He has recently got her in with the pace program locally that gives her an opportunity to do daytime activities from 8- 5, so she has supervision now and  not the safety issues they are having with her leaving  the house a few months ago. She a much better handle on her safety supervision now. Speech therapy is coming out of the house 2 times per week currently to help with organizational skills  Hyperlipidemia-continue statin, recheck labs today  Hypertension-continue current medication  Elevated alkaline phosphatase-recheck labs today, likely due to vitamin D deficiency  Abnormal thyroid labs a few months ago-I reviewed her recent endocrinology note and at that time her thyroid was euthyroid. I will see her back in May 2022  Estrogen deficiency, postmenopausal-she will go for bone density test  Onychomycosis and hypertrophic nails-referral to podiatry, consider Lamisil oral. We did discuss risk and benefits of medication   Jahniah was seen today for medicare wellness.  Diagnoses and all orders for this visit:  Encounter for health maintenance examination in adult -     EKG 12-Lead -     MM DIGITAL SCREENING BILATERAL; Future -     DG Bone Density; Future -     Comprehensive metabolic panel -     Lipid panel -     Hepatitis C antibody -     HIV Antibody (routine testing w rflx) -     VITAMIN D 25 Hydroxy (Vit-D Deficiency, Fractures)  Medicare annual wellness visit, initial  Vitamin D deficiency -     VITAMIN D 25 Hydroxy (Vit-D Deficiency, Fractures)  Memory change -     HIV Antibody (routine testing w rflx)  Mild cognitive impairment with memory loss  Hyperlipidemia, unspecified hyperlipidemia type -     Lipid panel  Estrogen deficiency -     DG Bone Density; Future  Essential hypertension, benign -     EKG 12-Lead  Advanced directives, counseling/discussion  Abnormal thyroid blood test  Vaccine counseling  Encounter for screening mammogram for malignant neoplasm of breast  Screen for colon cancer  Encounter for hepatitis C screening test for low risk patient -     Hepatitis C antibody  Screening for heart disease -     EKG 12-Lead  Onychomycosis -      Ambulatory referral to Podiatry  Enlarged and hypertrophic nails -  Ambulatory referral to Podiatry  Elevated alkaline phosphatase level  Other orders -     Pneumococcal conjugate vaccine 13-valent    Follow-up pending labs, yearly for physical   Medicare Attestation A preventative services visit was completed today.  During the course of the visit the patient was educated and counseled about appropriate screening and preventive services.  A health risk assessment was established with the patient that included a review of current medications, allergies, social history, family history, medical and preventative health history, biometrics, and preventative screenings to identify potential safety concerns or impairments.  A personalized plan was printed today for the patient's records and use.   Personalized health advice and education was given today to reduce health risks and promote self management and wellness.  Information regarding end of life planning was discussed today.  Kristian Covey, PA-C   07/14/2020

## 2020-07-15 LAB — COMPREHENSIVE METABOLIC PANEL
ALT: 12 IU/L (ref 0–32)
AST: 22 IU/L (ref 0–40)
Albumin/Globulin Ratio: 2.1 (ref 1.2–2.2)
Albumin: 4.7 g/dL (ref 3.8–4.8)
Alkaline Phosphatase: 162 IU/L — ABNORMAL HIGH (ref 44–121)
BUN/Creatinine Ratio: 15 (ref 12–28)
BUN: 14 mg/dL (ref 8–27)
Bilirubin Total: 0.4 mg/dL (ref 0.0–1.2)
CO2: 26 mmol/L (ref 20–29)
Calcium: 10.7 mg/dL — ABNORMAL HIGH (ref 8.7–10.3)
Chloride: 102 mmol/L (ref 96–106)
Creatinine, Ser: 0.96 mg/dL (ref 0.57–1.00)
GFR calc Af Amer: 70 mL/min/{1.73_m2} (ref >59)
GFR calc non Af Amer: 61 mL/min/{1.73_m2} (ref >59)
Globulin, Total: 2.2 g/dL (ref 1.5–4.5)
Glucose: 85 mg/dL (ref 65–99)
Potassium: 4.1 mmol/L (ref 3.5–5.2)
Sodium: 143 mmol/L (ref 134–144)
Total Protein: 6.9 g/dL (ref 6.0–8.5)

## 2020-07-15 LAB — LIPID PANEL
Chol/HDL Ratio: 2 ratio (ref 0.0–4.4)
Cholesterol, Total: 224 mg/dL — ABNORMAL HIGH (ref 100–199)
HDL: 112 mg/dL (ref >39)
LDL Chol Calc (NIH): 98 mg/dL (ref 0–99)
Triglycerides: 82 mg/dL (ref 0–149)
VLDL Cholesterol Cal: 14 mg/dL (ref 5–40)

## 2020-07-15 LAB — VITAMIN D 25 HYDROXY (VIT D DEFICIENCY, FRACTURES): Vit D, 25-Hydroxy: 23.1 ng/mL — ABNORMAL LOW (ref 30.0–100.0)

## 2020-07-15 LAB — HIV ANTIBODY (ROUTINE TESTING W REFLEX): HIV Screen 4th Generation wRfx: NONREACTIVE

## 2020-07-15 LAB — HEPATITIS C ANTIBODY: Hep C Virus Ab: <0.1 s/co ratio (ref 0.0–0.9)

## 2020-07-17 ENCOUNTER — Other Ambulatory Visit: Payer: Self-pay | Admitting: Medical

## 2020-07-17 ENCOUNTER — Telehealth: Payer: Self-pay | Admitting: Medical

## 2020-07-17 DIAGNOSIS — R748 Abnormal levels of other serum enzymes: Secondary | ICD-10-CM

## 2020-07-17 MED ORDER — POTASSIUM CHLORIDE CRYS ER 10 MEQ PO TBCR: 10.0000 meq | EXTENDED_RELEASE_TABLET | Freq: Every day | ORAL | 2 refills | Status: DC

## 2020-07-17 MED ORDER — LOVASTATIN 20 MG PO TABS: 20.0000 mg | ORAL_TABLET | Freq: Every day | ORAL | 2 refills | Status: DC

## 2020-07-17 MED ORDER — DILTIAZEM HCL ER COATED BEADS 240 MG PO CP24: 240.0000 mg | ORAL_CAPSULE | Freq: Every day | ORAL | 2 refills | Status: DC

## 2020-07-17 MED ORDER — VITAMIN D-3 125 MCG (5000 UT) PO TABS: 1.0000 | ORAL_TABLET | Freq: Every day | ORAL | 2 refills | Status: DC

## 2020-07-17 MED ORDER — HYDROCHLOROTHIAZIDE 25 MG PO TABS: 25.0000 mg | ORAL_TABLET | Freq: Every day | ORAL | 2 refills | Status: DC

## 2020-07-17 MED ORDER — HYDRALAZINE HCL 100 MG PO TABS: 100.0000 mg | ORAL_TABLET | Freq: Three times a day (TID) | ORAL | 2 refills | Status: DC

## 2020-07-17 NOTE — Telephone Encounter (Signed)
Pts son called and said pt needs refill on all other medications because some of them expired because she was not taking them correctly. [facility] uses the [facility]

## 2020-07-18 ENCOUNTER — Ambulatory Visit: Payer: Medicare HMO | Admitting: Podiatry

## 2020-07-19 ENCOUNTER — Telehealth: Payer: Self-pay

## 2020-07-19 NOTE — Telephone Encounter (Signed)
I had already approved verbal.  Why am I getting another request

## 2020-07-19 NOTE — Telephone Encounter (Signed)
Minda with [facility] called wanting to get verbal orders for a nursing evaluation, pt. Recently started new BP medicine and they would like a nurse to monitor this. Jeraldine Loots can be reached at [phone].

## 2020-07-20 NOTE — Telephone Encounter (Signed)
Left detailed verbal on VM for nursing eval.

## 2020-07-27 ENCOUNTER — Ambulatory Visit: Payer: Medicare HMO | Admitting: Podiatry

## 2020-07-27 ENCOUNTER — Other Ambulatory Visit: Payer: Self-pay

## 2020-07-27 DIAGNOSIS — B351 Tinea unguium: Secondary | ICD-10-CM

## 2020-07-27 DIAGNOSIS — M79674 Pain in right toe(s): Secondary | ICD-10-CM

## 2020-07-27 DIAGNOSIS — M79675 Pain in left toe(s): Secondary | ICD-10-CM

## 2020-07-27 DIAGNOSIS — G629 Polyneuropathy, unspecified: Secondary | ICD-10-CM

## 2020-08-01 ENCOUNTER — Ambulatory Visit (HOSPITAL_COMMUNITY): Payer: Self-pay

## 2020-08-01 NOTE — Progress Notes (Signed)
Subjective:   Patient ID: Gail Knapp, female   DOB: 70 y.o.   MRN: [id number]   HPI 70 year old female presents the office today with concerns of thick, discolored toenails that she cannot trim her self.  She denies any ulcerations.  She has no other concerns to her lower extremities today.   Review of Systems  All other systems reviewed and are negative.  Past Medical History:  Diagnosis Date  . Depression   . Hyperlipidemia   . Hypertension   . Hypothyroidism    prior medication per patient as of 03/2020, but none in a while  . Memory change   . Memory change 2021  . Requires continuous supervision for activities of daily living (ADL) 2022  . Vitamin D deficiency     Past Surgical History:  Procedure Laterality Date  . COLONOSCOPY     prior, but can't recall date     Current Outpatient Medications:  .  Cholecalciferol (VITAMIN D-3) 125 MCG (5000 UT) TABS, Take 1 capsule by mouth daily., Disp: 30 tablet, Rfl: 2 .  diltiazem (CARDIZEM CD) 240 MG 24 hr capsule, Take 1 capsule (240 mg total) by mouth daily., Disp: 30 capsule, Rfl: 2 .  hydrALAZINE (APRESOLINE) 100 MG tablet, Take 1 tablet (100 mg total) by mouth 3 (three) times daily., Disp: 90 tablet, Rfl: 2 .  hydrochlorothiazide (HYDRODIURIL) 25 MG tablet, Take 1 tablet (25 mg total) by mouth daily., Disp: 30 tablet, Rfl: 2 .  lovastatin (MEVACOR) 20 MG tablet, Take 1 tablet (20 mg total) by mouth at bedtime., Disp: 30 tablet, Rfl: 2 .  memantine (NAMENDA) 10 MG tablet, Take 1 tablet (10 mg total) by mouth 2 (two) times daily. TAKE 1 AT BEDTIME FOR 2 WEEKS, THEN TWICE DAILY, Disp: 180 tablet, Rfl: 1 .  potassium chloride (KLOR-CON) 10 MEQ tablet, Take 1 tablet (10 mEq total) by mouth daily., Disp: 30 tablet, Rfl: 2  Allergies  Allergen Reactions  . Latex Rash          Objective:  Physical Exam  General: AAO x3, NAD  Dermatological: Nails are hypertrophic, dystrophic, brittle, discolored, elongated 10. No  surrounding redness or drainage. Tenderness nails 1-5 bilaterally. No open lesions or pre-ulcerative lesions are identified today.  Vascular: Dorsalis Pedis artery and Posterior Tibial artery pedal pulses are 2/4 bilateral with immedate capillary fill time. There is no pain with calf compression, swelling, warmth, erythema.   Neruologic: Sensation decreased with Semmes Weinstein monofilament.  She has no other symptoms of neuropathy.  Musculoskeletal: No pain, crepitus, or limitation noted with foot and ankle range of motion bilateral. Muscular strength 5/5 in all groups tested bilateral.     Assessment:   70 year old female with symptomatic onychomycosis     Plan:  -Treatment options discussed including all alternatives, risks, and complications -Etiology of symptoms were discussed -Nails sharply debrided x10 without any complications or bleeding -She does have decrease in station with Phoebe Perch monofilament but no other symptoms.  We discussed etiology of this as well as symptoms to watch out for. -Daily foot inspection discussed  Return in about 3 months (around 10/27/2020).  Vivi Barrack DPM

## 2020-08-04 ENCOUNTER — Ambulatory Visit (HOSPITAL_COMMUNITY): Payer: Self-pay

## 2020-08-08 ENCOUNTER — Ambulatory Visit (HOSPITAL_COMMUNITY): Payer: Self-pay

## 2020-08-10 ENCOUNTER — Ambulatory Visit (INDEPENDENT_AMBULATORY_CARE_PROVIDER_SITE_OTHER): Payer: Medicare HMO

## 2020-08-10 ENCOUNTER — Ambulatory Visit (HOSPITAL_COMMUNITY)
Admission: RE | Admit: 2020-08-10 | Discharge: 2020-08-10 | Disposition: A | Discharge status: Home / Self Care | Payer: Medicare HMO | Source: Ambulatory Visit | Attending: Urgent Care | Admitting: Urgent Care

## 2020-08-10 ENCOUNTER — Other Ambulatory Visit: Payer: Self-pay

## 2020-08-10 ENCOUNTER — Encounter (HOSPITAL_COMMUNITY): Payer: Self-pay

## 2020-08-10 ENCOUNTER — Telehealth: Payer: Self-pay

## 2020-08-10 VITALS — BP 129/63 | HR 86 | Temp 98.5°F | Resp 16

## 2020-08-10 DIAGNOSIS — R110 Nausea: Secondary | ICD-10-CM

## 2020-08-10 DIAGNOSIS — R5383 Other fatigue: Secondary | ICD-10-CM | POA: Diagnosis not present

## 2020-08-10 DIAGNOSIS — R531 Weakness: Secondary | ICD-10-CM | POA: Diagnosis not present

## 2020-08-10 LAB — POCT URINALYSIS DIPSTICK, ED / UC
Bilirubin Urine: NEGATIVE
Glucose, UA: NEGATIVE mg/dL
Hgb urine dipstick: NEGATIVE
Ketones, ur: NEGATIVE mg/dL
Nitrite: NEGATIVE
Protein, ur: NEGATIVE mg/dL
Specific Gravity, Urine: 1.02 (ref 1.005–1.030)
Urobilinogen, UA: 1 mg/dL (ref 0.0–1.0)
pH: 6.5 (ref 5.0–8.0)

## 2020-08-10 LAB — CBG MONITORING, ED: Glucose-Capillary: 87 mg/dL (ref 70–99)

## 2020-08-10 NOTE — Telephone Encounter (Signed)
They will need to get the [facility] form  1 question would be whether she is taking the medicines as directed on the label.  I am assuming someone is administering her medicines and not leaving it up to her at this point?

## 2020-08-10 NOTE — ED Triage Notes (Signed)
Pt presents with fatigue, nausea, and intermittent nosebleeds over the course of 3 weeks.  According to son she has a a negative flu panel and 2 negative covid test over 2 weeks; had flu vaccine X 3 weeks ago.

## 2020-08-10 NOTE — Telephone Encounter (Signed)
Son Gail Knapp called & states mom is going to need to go to Assisted Living and he needs FL-2 form completed & please call when ready. Also for the past 3 weeks she has been sleeping all day long & night, he took her to [facility] and had Covid and Flu both negative. Then took her to Urgent Care today did CXR & checked for UTI all good.  He questioned about her 3 BP medicines if that could be causes weakness, fatigue.  Wants to know if you want to see her or what you think? Please advise (Urgent care notes are in chart review)

## 2020-08-10 NOTE — ED Provider Notes (Signed)
[place]   MRN: [id number] DOB: April 01, 1951  Subjective:   Gail Knapp is a 70 y.o. female presenting for 2 to 3-week history of persistent fatigue, weakness, malaise, intermittent nausea and nosebleeds.  Patient has PCP and has had regular follow-up, has had multiple lab levels done this year alone.  Has also tested negative for flu and Covid in the past 2 weeks.  She did have her flu vaccine 3 weeks ago.  They did also see a thyroid specialist, was advised to hold off on any medications as her labs have been equivocal.  They do have an appointment for follow-up in June.  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  No falls, trauma, painful urination, frequent urination.  Denies history of heart attack, stroke, diabetes.  There are labs pending to look at her liver enzymes, parathyroid hormones.  No current facility-administered medications for this encounter.  Current Outpatient Medications:  .  Cholecalciferol (VITAMIN D-3) 125 MCG (5000 UT) TABS, Take 1 capsule by mouth daily., Disp: 30 tablet, Rfl: 2 .  diltiazem (CARDIZEM CD) 240 MG 24 hr capsule, Take 1 capsule (240 mg total) by mouth daily., Disp: 30 capsule, Rfl: 2 .  hydrALAZINE (APRESOLINE) 100 MG tablet, Take 1 tablet (100 mg total) by mouth 3 (three) times daily., Disp: 90 tablet, Rfl: 2 .  hydrochlorothiazide (HYDRODIURIL) 25 MG tablet, Take 1 tablet (25 mg total) by mouth daily., Disp: 30 tablet, Rfl: 2 .  lovastatin (MEVACOR) 20 MG tablet, Take 1 tablet (20 mg total) by mouth at bedtime., Disp: 30 tablet, Rfl: 2 .  memantine (NAMENDA) 10 MG tablet, Take 1 tablet (10 mg total) by mouth 2 (two) times daily. TAKE 1 AT BEDTIME FOR 2 WEEKS, THEN TWICE DAILY, Disp: 180 tablet, Rfl: 1 .  potassium chloride (KLOR-CON) 10 MEQ tablet, Take 1 tablet (10 mEq total) by mouth daily., Disp: 30 tablet, Rfl: 2   Allergies   Allergen Reactions  . Latex Rash    Past Medical History:  Diagnosis Date  . Depression   . Hyperlipidemia   . Hypertension   . Hypothyroidism    prior medication per patient as of 03/2020, but none in a while  . Memory change   . Memory change 2021  . Requires continuous supervision for activities of daily living (ADL) 2022  . Vitamin D deficiency      Past Surgical History:  Procedure Laterality Date  . COLONOSCOPY     prior, but can't recall date    Family History  Problem Relation Age of Onset  . Hypertension Mother   . Other Mother        "memory loss"  . Hypertension Brother     Social History   Tobacco Use  . Smoking status: Former Smoker    Packs/day: 1.00    Quit date: 04/14/2015    Years since quitting: 5.3  . Smokeless tobacco: Never Used  Substance Use Topics  . Alcohol use: Not Currently  . Drug use: Never    ROS   Objective:   Vitals: BP 129/63 (BP Location: Left Arm)   Pulse 86   Temp 98.5 F (36.9 C) (Oral)   Resp 16   LMP  (LMP Unknown)   SpO2 96%   Physical Exam Constitutional:      General: She is not in acute distress.    Appearance: Normal  appearance. She is well-developed and normal weight. She is not ill-appearing, toxic-appearing or diaphoretic.  HENT:     Head: Normocephalic and atraumatic.     Right Ear: External ear normal.     Left Ear: External ear normal.     Nose: Nose normal.     Mouth/Throat:     Mouth: Mucous membranes are moist.     Pharynx: Oropharynx is clear.  Eyes:     General: No scleral icterus.    Extraocular Movements: Extraocular movements intact.     Pupils: Pupils are equal, round, and reactive to light.  Cardiovascular:     Rate and Rhythm: Normal rate and regular rhythm.     Pulses: Normal pulses.     Heart sounds: Normal heart sounds. No murmur heard. No friction rub. No gallop.   Pulmonary:     Effort: Pulmonary effort is normal. No respiratory distress.     Breath sounds: Normal  breath sounds. No stridor. No wheezing, rhonchi or rales.  Abdominal:     General: Bowel sounds are normal. There is no distension.     Palpations: Abdomen is soft. There is no mass.     Tenderness: There is no abdominal tenderness. There is no right CVA tenderness, left CVA tenderness, guarding or rebound.  Skin:    General: Skin is warm and dry.     Coloration: Skin is not pale.     Findings: No rash.  Neurological:     General: No focal deficit present.     Mental Status: She is alert and oriented to person, place, and time.  Psychiatric:        Mood and Affect: Mood normal.        Behavior: Behavior normal.        Thought Content: Thought content normal.        Judgment: Judgment normal.     DG Chest 2 View  Result Date: 08/10/2020 CLINICAL DATA:  Fatigue and nausea with intermittent nosebleeds. EXAM: CHEST - 2 VIEW COMPARISON:  None. FINDINGS: Normal heart size and mediastinal contours. No acute infiltrate or edema. Nipple shadows. No effusion or pneumothorax. No acute osseous findings. IMPRESSION: No evidence of active disease. Electronically Signed   By: Monte Fantasia M.D.   On: 08/10/2020 10:07     Results for orders placed or performed during the hospital encounter of 08/10/20 (from the past 24 hour(s))  POC CBG monitoring     Status: None   Collection Time: 08/10/20  9:07 AM  Result Value Ref Range   Glucose-Capillary 87 70 - 99 mg/dL  POC Urinalysis dipstick     Status: Abnormal   Collection Time: 08/10/20  9:46 AM  Result Value Ref Range   Glucose, UA NEGATIVE NEGATIVE mg/dL   Bilirubin Urine NEGATIVE NEGATIVE   Ketones, ur NEGATIVE NEGATIVE mg/dL   Specific Gravity, Urine 1.020 1.005 - 1.030   Hgb urine dipstick NEGATIVE NEGATIVE   pH 6.5 5.0 - 8.0   Protein, ur NEGATIVE NEGATIVE mg/dL   Urobilinogen, UA 1.0 0.0 - 1.0 mg/dL   Nitrite NEGATIVE NEGATIVE   Leukocytes,Ua SMALL (A) NEGATIVE    Recent Results (from the past 2160 hour(s))  TRAb (TSH Receptor  Binding Antibody)     Status: None   Collection Time: 06/19/20  8:23 AM  Result Value Ref Range   TRAB <1.00 <=2.00 IU/L    Comment: . This test was performed using the TRAb Antibody ELISA method which is standardized against the 1st  International Standard [phone] and is reported in International Units (IU/L). The reference range reported was established specifically for this test method. .   T3     Status: None   Collection Time: 06/19/20  8:23 AM  Result Value Ref Range   T3, Total 106 76 - 181 ng/dL  T4, free     Status: Abnormal   Collection Time: 06/19/20  8:23 AM  Result Value Ref Range   Free T4 0.54 (L) 0.60 - 1.60 ng/dL    Comment: Specimens from patients who are undergoing biotin therapy and /or ingesting biotin supplements may contain high levels of biotin.  The higher biotin concentration in these specimens interferes with this Free T4 assay.  Specimens that contain high levels  of biotin may cause false high results for this Free T4 assay.  Please interpret results in light of the total clinical presentation of the patient.    TSH     Status: None   Collection Time: 06/19/20  8:23 AM  Result Value Ref Range   TSH 0.35 0.35 - 4.50 uIU/mL  Comprehensive metabolic panel     Status: Abnormal   Collection Time: 07/14/20  9:41 AM  Result Value Ref Range   Glucose 85 65 - 99 mg/dL   BUN 14 8 - 27 mg/dL   Creatinine, Ser 0.96 0.57 - 1.00 mg/dL    Comment:                **Effective July 17, 2020 [facility] will begin**                  reporting the 2021 CKD-EPI creatinine equation that                  estimates kidney function without a race variable.    GFR calc non Af Amer 61 >59 mL/min/1.73   GFR calc Af Amer 70 >59 mL/min/1.73    Comment: **In accordance with recommendations from the NKF-ASN Task force,**   [facility] is in the process of updating its eGFR calculation to the   2021 CKD-EPI creatinine equation that estimates kidney function   without a race  variable.    BUN/Creatinine Ratio 15 12 - 28   Sodium 143 134 - 144 mmol/L   Potassium 4.1 3.5 - 5.2 mmol/L   Chloride 102 96 - 106 mmol/L   CO2 26 20 - 29 mmol/L   Calcium 10.7 (H) 8.7 - 10.3 mg/dL   Total Protein 6.9 6.0 - 8.5 g/dL   Albumin 4.7 3.8 - 4.8 g/dL   Globulin, Total 2.2 1.5 - 4.5 g/dL   Albumin/Globulin Ratio 2.1 1.2 - 2.2   Bilirubin Total 0.4 0.0 - 1.2 mg/dL   Alkaline Phosphatase 162 (H) 44 - 121 IU/L   AST 22 0 - 40 IU/L   ALT 12 0 - 32 IU/L  Lipid panel     Status: Abnormal   Collection Time: 07/14/20  9:41 AM  Result Value Ref Range   Cholesterol, Total 224 (H) 100 - 199 mg/dL   Triglycerides 82 0 - 149 mg/dL   HDL 112 >39 mg/dL   VLDL Cholesterol Cal 14 5 - 40 mg/dL   LDL Chol Calc (NIH) 98 0 - 99 mg/dL   Chol/HDL Ratio 2.0 0.0 - 4.4 ratio    Comment:  T. Chol/HDL Ratio                                             Men  Women                               1/2 Avg.Risk  3.4    3.3                                   Avg.Risk  5.0    4.4                                2X Avg.Risk  9.6    7.1                                3X Avg.Risk 23.4   11.0   Hepatitis C antibody     Status: None   Collection Time: 07/14/20  9:41 AM  Result Value Ref Range   Hep C Virus Ab <0.1 0.0 - 0.9 s/co ratio    Comment:                                   Negative:     < 0.8                              Indeterminate: 0.8 - 0.9                                   Positive:     > 0.9  The CDC recommends that a positive HCV antibody result  be followed up with a HCV Nucleic Acid Amplification  test ([id number]).   HIV Antibody (routine testing w rflx)     Status: None   Collection Time: 07/14/20  9:41 AM  Result Value Ref Range   HIV Screen 4th Generation wRfx Non Reactive Non Reactive    Comment: HIV Negative HIV-1/HIV-2 antibodies and HIV-1 p24 antigen were NOT detected. There is no laboratory evidence of HIV infection.   VITAMIN D 25 Hydroxy  (Vit-D Deficiency, Fractures)     Status: Abnormal   Collection Time: 07/14/20  9:41 AM  Result Value Ref Range   Vit D, 25-Hydroxy 23.1 (L) 30.0 - 100.0 ng/mL    Comment: Vitamin D deficiency has been defined by the [place] practice guideline as a level of serum 25-OH vitamin D less than 20 ng/mL (1,2). The Endocrine Society went on to further define vitamin D insufficiency as a level between 21 and 29 ng/mL (2). 1. IOM (Institute of Medicine). 2010. Dietary reference    intakes for calcium and D. [place]: The    [employer]. 2. Holick MF, Binkley [place], Bischoff-Ferrari HA, et al.    Evaluation, treatment, and prevention of vitamin D    deficiency: an Endocrine Society clinical practice    guideline. JCEM. 2011 Jul; 96(7):1911-30.  POCT Urinalysis DIP (Proadvantage Device)     Status: Normal   Collection Time: 07/14/20 10:40 AM  Result Value Ref Range   Color, UA yellow yellow   Clarity, UA clear clear   Glucose, UA negative negative mg/dL   Bilirubin, UA negative negative   Ketones, POC UA negative negative mg/dL   Specific Gravity, Urine 1.015    Blood, UA negative negative   pH, UA 8.0 5.0 - 8.0   Protein Ur, POC negative negative mg/dL   Urobilinogen, Ur 0.2    Nitrite, UA Negative Negative   Leukocytes, UA Negative Negative  POC CBG monitoring     Status: None   Collection Time: 08/10/20  9:07 AM  Result Value Ref Range   Glucose-Capillary 87 70 - 99 mg/dL    Comment: Glucose reference range applies only to samples taken after fasting for at least 8 hours.  POC Urinalysis dipstick     Status: Abnormal   Collection Time: 08/10/20  9:46 AM  Result Value Ref Range   Glucose, UA NEGATIVE NEGATIVE mg/dL   Bilirubin Urine NEGATIVE NEGATIVE   Ketones, ur NEGATIVE NEGATIVE mg/dL   Specific Gravity, Urine 1.020 1.005 - 1.030   Hgb urine dipstick NEGATIVE NEGATIVE   pH 6.5 5.0 - 8.0   Protein, ur NEGATIVE NEGATIVE  mg/dL   Urobilinogen, UA 1.0 0.0 - 1.0 mg/dL   Nitrite NEGATIVE NEGATIVE   Leukocytes,Ua SMALL (A) NEGATIVE    Comment: Biochemical Testing Only. Please order routine urinalysis from main lab if confirmatory testing is needed.     Assessment and Plan :   PDMP not reviewed this encounter.  1. Weakness   2. Fatigue, unspecified type   3. Nausea without vomiting     Gail Knapp is a 70 year old female with past medical history of hyperlipidemia, hypertension, vitamin D deficiency, cognitive impairment with memory loss presenting with 2 to 3-week history of persistent fatigue and weakness.  Point-of-care testing is very reassuring including urinalysis, blood sugar.  She also has stable vital signs and a normal chest x-ray.  For now recommended conservative management and very close follow-up with her PCP. Counseled patient on potential for adverse effects with medications prescribed/recommended today, ER and return-to-clinic precautions discussed, patient verbalized understanding.    Jaynee Eagles, PA-C 08/10/20 [id number]

## 2020-08-10 NOTE — Discharge Instructions (Signed)
The urinalysis, blood sugar and chest x-ray looks really good today.  Vital signs are really normal as well.  Overall heart and lung sounds are really reassuring as well.  Please contact your PCP and see if they can get you in sooner for recheck.  Make sure you are drinking plenty of fluids and eating 3 regular healthy balanced meals.

## 2020-08-11 NOTE — Telephone Encounter (Signed)
No lets just get the FL2 to complete next

## 2020-08-11 NOTE — Telephone Encounter (Signed)
Called & spoke with Gail Knapp and he said he is giving pt her meds to her and managing those.  Also he will get an FL2 form and bring it by the office.  Do you want to see pt or what do you recommend?

## 2020-08-14 ENCOUNTER — Telehealth: Payer: Self-pay

## 2020-08-14 ENCOUNTER — Telehealth: Payer: Self-pay | Admitting: Medical

## 2020-08-14 NOTE — Telephone Encounter (Signed)
Pts son dropped off two FL2 forms for his mom to go into an assisted living facility he needs them completed as soon as possible. Forms placed on Gail Knapp's desk

## 2020-08-14 NOTE — Telephone Encounter (Signed)
Pt. Son called wanting to schedule his mom for a TB blood test because he is trying to get her into an assisted living place asap. I told  Him I had to get you to ok first and put the order in first before I could schedule her.

## 2020-08-15 ENCOUNTER — Other Ambulatory Visit: Payer: Medicare HMO

## 2020-08-15 ENCOUNTER — Other Ambulatory Visit: Payer: Self-pay

## 2020-08-15 ENCOUNTER — Other Ambulatory Visit: Payer: Self-pay | Admitting: Medical

## 2020-08-15 DIAGNOSIS — Z111 Encounter for screening for respiratory tuberculosis: Secondary | ICD-10-CM

## 2020-08-15 DIAGNOSIS — R748 Abnormal levels of other serum enzymes: Secondary | ICD-10-CM

## 2020-08-15 NOTE — Telephone Encounter (Signed)
I put in lab for quantiferon tb test.  So yes, can put her on nurse schedule for this

## 2020-08-15 NOTE — Telephone Encounter (Signed)
Pt. Scheduled for lab this afternoon at 1:30. [name] did ask about the [facility] form wanting to know if you had a chance to fill that out yet. He is trying to get his mom a room there asap and they have only one room left.

## 2020-08-15 NOTE — Telephone Encounter (Signed)
Forms returned.

## 2020-08-15 NOTE — Telephone Encounter (Signed)
Already gave to [facility]

## 2020-08-18 LAB — ALKALINE PHOSPHATASE, ISOENZYMES
Alkaline Phosphatase: 117 IU/L (ref 44–121)
BONE FRACTION: 36 % (ref 14–68)
INTESTINAL FRAC.: 4 % (ref 0–18)
LIVER FRACTION: 60 % (ref 18–85)

## 2020-08-18 LAB — PTH, INTACT AND CALCIUM
Calcium: 10.6 mg/dL — ABNORMAL HIGH (ref 8.7–10.3)
PTH: 54 pg/mL (ref 15–65)

## 2020-08-18 LAB — QUANTIFERON-TB GOLD PLUS
QuantiFERON Mitogen Value: 2.03 IU/mL
QuantiFERON Nil Value: 0.01 IU/mL
QuantiFERON TB1 Ag Value: 0.52 IU/mL
QuantiFERON TB2 Ag Value: 0.02 IU/mL
QuantiFERON-TB Gold Plus: POSITIVE — AB

## 2020-08-22 ENCOUNTER — Other Ambulatory Visit: Payer: Self-pay

## 2020-08-22 DIAGNOSIS — R748 Abnormal levels of other serum enzymes: Secondary | ICD-10-CM

## 2020-08-22 NOTE — Telephone Encounter (Signed)
FL2 completed and son picked up

## 2020-08-28 ENCOUNTER — Other Ambulatory Visit: Payer: Medicare HMO

## 2020-08-28 ENCOUNTER — Telehealth: Payer: Self-pay | Admitting: Family Medicine

## 2020-08-28 ENCOUNTER — Other Ambulatory Visit: Payer: Self-pay | Admitting: Medical

## 2020-08-28 ENCOUNTER — Other Ambulatory Visit: Payer: Self-pay

## 2020-08-28 DIAGNOSIS — Z111 Encounter for screening for respiratory tuberculosis: Secondary | ICD-10-CM

## 2020-08-28 NOTE — Telephone Encounter (Signed)
Son called and states health dept said pt has had a false positive TB read and wants her to have another blood draw.  I scheduled her for today.

## 2020-08-31 LAB — QUANTIFERON-TB GOLD PLUS
QuantiFERON Mitogen Value: 8.2 IU/mL
QuantiFERON Nil Value: 0.04 IU/mL
QuantiFERON TB1 Ag Value: 0.03 IU/mL
QuantiFERON TB2 Ag Value: 0.03 IU/mL
QuantiFERON-TB Gold Plus: NEGATIVE

## 2020-09-14 LAB — COLOGUARD: Cologuard: NEGATIVE

## 2020-09-15 ENCOUNTER — Encounter: Payer: Self-pay | Admitting: Medical

## 2020-09-15 ENCOUNTER — Ambulatory Visit (INDEPENDENT_AMBULATORY_CARE_PROVIDER_SITE_OTHER): Payer: Medicare HMO | Admitting: Medical

## 2020-09-15 ENCOUNTER — Other Ambulatory Visit: Payer: Self-pay

## 2020-09-15 VITALS — BP 124/60 | HR 102 | Ht 65.0 in | Wt 108.4 lb

## 2020-09-15 DIAGNOSIS — E0590 Thyrotoxicosis, unspecified without thyrotoxic crisis or storm: Secondary | ICD-10-CM | POA: Diagnosis not present

## 2020-09-15 DIAGNOSIS — R748 Abnormal levels of other serum enzymes: Secondary | ICD-10-CM | POA: Diagnosis not present

## 2020-09-15 DIAGNOSIS — E559 Vitamin D deficiency, unspecified: Secondary | ICD-10-CM

## 2020-09-15 DIAGNOSIS — G3184 Mild cognitive impairment, so stated: Secondary | ICD-10-CM

## 2020-09-15 DIAGNOSIS — I10 Essential (primary) hypertension: Secondary | ICD-10-CM

## 2020-09-15 DIAGNOSIS — R040 Epistaxis: Secondary | ICD-10-CM | POA: Diagnosis not present

## 2020-09-15 NOTE — Progress Notes (Signed)
Subjective:  Gail Knapp is a 70 y.o. female who presents for Chief Complaint  Patient presents with  . Epistaxis    Nose bleeds 2-3 x a week for about 2-3 months      Here with son who is her caregiver  Been having nose bleeds 2-3 x/ week for 2-3 months out of left nostril.  Usually can get it to stop quickly.    Not using any allergy medicaiton or nasal sprays.  Has gas heat at home.  Not picking nose, no recent congestion.    Otherwise in usual state of health  Son manages her medications  No other aggravating or relieving factors.    No other c/o.  Past Medical History:  Diagnosis Date  . Depression   . Hyperlipidemia   . Hypertension   . Hypothyroidism    prior medication per patient as of 03/2020, but none in a while  . Memory change   . Memory change 2021  . Requires continuous supervision for activities of daily living (ADL) 2022  . Vitamin D deficiency      The following portions of the patient's history were reviewed and updated as appropriate: allergies, current medications, past family history, past medical history, past social history, past surgical history and problem list.  ROS Otherwise as in subjective above    Objective: BP 124/60   Pulse (!) 102   Ht 5\' 5"  (1.651 m)   Wt 108 lb 6.4 oz (49.2 kg)   LMP  (LMP Unknown)   SpO2 99%   BMI 18.04 kg/m    Wt Readings from Last 3 Encounters:  09/15/20 108 lb 6.4 oz (49.2 kg)  07/14/20 110 lb 6.4 oz (50.1 kg)  06/21/20 110 lb 6.4 oz (50.1 kg)    General appearance: alert, no distress, well developed, well nourished HEENT: normocephalic, sclerae anicteric, conjunctiva pink and moist, TMs pearly, nares patent, slight dry mucoid and blood tinged discharge, but no friable appearance, no mass, pharynx normal Oral cavity: MMM, no lesions Neck: supple, no lymphadenopathy, no thyromegaly, no masses    Assessment: Encounter Diagnoses  Name Primary?  08/19/20 Nosebleed Yes  . Subclinical hyperthyroidism    . Alkaline phosphatase elevation   . Vitamin D deficiency   . Essential hypertension, benign   . Mild cognitive impairment with memory loss      Plan: I discussed the nosebleeds but also her other underlying health issues and problem list being evaluated  I reviewed her recent endocrinology notes regarding subclinical hypothyroidism.  She will follow-up next month in may.  No therapy was initiated at the last visit  She has follow-up soon with gastroenterology regarding elevated alkaline phosphatase liver portion.  Her alkaline phosphatase had improved with vitamin D supplementation  If nosebleeds continue she will follow-up  Patient Instructions  Nosebleeds  Increase water intake, try to get 80 to 100 ounces of water daily  For the next 1 or 2 weeks use triple antibiotic ointment or Vaseline or Neosporin at home on a Q-tip in each nostril once or twice a day to lubricate the nostrils  Cut the hydrochlorothiazide 25 mg blood pressure pill in half and just use 12.5 mg daily for the time being.  This may be drying out your mucous membranes too much which may be triggering the nosebleeds  Check blood pressures a few times per week with goal being around 120/70.  If you are starting to see readings such as 140/ 90 or higher than let me  know  Make sure you mention the nosebleeds to the gastroenterologist if they are still occurring  If you continue to get nosebleeds we will need to check blood count and coag labs.    Plan to follow-up with Dr. Lonzo Cloud in May for recheck on the thyroid   We referred you to gastroenterology due to alkaline phosphatase lab and evaluation of liver.  If you do not see that you have an appointment then let me know     Follow up: as planned with endocrinology and gastroenterology

## 2020-09-15 NOTE — Patient Instructions (Addendum)
Nosebleeds  Increase water intake, try to get 80 to 100 ounces of water daily  For the next 1 or 2 weeks use triple antibiotic ointment or Vaseline or Neosporin at home on a Q-tip in each nostril once or twice a day to lubricate the nostrils  Cut the hydrochlorothiazide 25 mg blood pressure pill in half and just use 12.5 mg daily for the time being.  This may be drying out your mucous membranes too much which may be triggering the nosebleeds  Check blood pressures a few times per week with goal being around 120/70.  If you are starting to see readings such as 140/ 90 or higher than let me know  Make sure you mention the nosebleeds to the gastroenterologist if they are still occurring  If you continue to get nosebleeds we will need to check blood count and coag labs.    Plan to follow-up with Dr. Lonzo Cloud in May for recheck on the thyroid   We referred you to gastroenterology due to alkaline phosphatase lab and evaluation of liver.  If you do not see that you have an appointment then let me know

## 2020-09-22 ENCOUNTER — Telehealth: Payer: Self-pay | Admitting: Medical

## 2020-09-22 NOTE — Telephone Encounter (Signed)
Call son:  Please let them know that the Cologuard screening for colon cancer was negative.  This indicates a lower likelihood that colorectal cancer is present.   Lets plan to repeat this in 3 years.  However, if the develop bowel changes, blood in stool, unexpected weight loss, or other new bowel changes, then recheck.

## 2020-09-25 ENCOUNTER — Encounter: Payer: Self-pay | Admitting: Medical

## 2020-09-25 NOTE — Telephone Encounter (Signed)
Left detail message on machine for patient. 

## 2020-09-26 ENCOUNTER — Encounter: Payer: Self-pay | Admitting: Medical

## 2020-10-06 ENCOUNTER — Encounter: Payer: Self-pay | Admitting: Physician Assistant

## 2020-10-12 ENCOUNTER — Other Ambulatory Visit: Payer: Self-pay | Admitting: Medical

## 2020-10-20 ENCOUNTER — Ambulatory Visit: Payer: Medicare HMO | Admitting: Internal Medicine

## 2020-11-01 ENCOUNTER — Ambulatory Visit: Payer: Medicare HMO | Admitting: Physician Assistant

## 2020-11-02 ENCOUNTER — Ambulatory Visit (INDEPENDENT_AMBULATORY_CARE_PROVIDER_SITE_OTHER): Payer: Medicare (Managed Care) | Admitting: Podiatry

## 2020-11-02 ENCOUNTER — Other Ambulatory Visit: Payer: Self-pay

## 2020-11-02 DIAGNOSIS — M79674 Pain in right toe(s): Secondary | ICD-10-CM | POA: Diagnosis not present

## 2020-11-02 DIAGNOSIS — M79675 Pain in left toe(s): Secondary | ICD-10-CM | POA: Diagnosis not present

## 2020-11-02 DIAGNOSIS — B351 Tinea unguium: Secondary | ICD-10-CM | POA: Diagnosis not present

## 2020-11-02 MED ORDER — CICLOPIROX 8 % EX SOLN: Freq: Every day | CUTANEOUS | 2 refills | Status: DC

## 2020-11-02 NOTE — Patient Instructions (Signed)
Ciclopirox nail solution What is this medication? CICLOPIROX (sye kloe PEER ox) NAIL SOLUTION is an antifungal medicine. It usedto treat fungal infections of the nails. This medicine may be used for other purposes; ask your health care provider orpharmacist if you have questions. COMMON BRAND NAME(S): CNL8, Penlac What should I tell my care team before I take this medication? They need to know if you have any of these conditions: diabetes mellitus history of seizures HIV infection immune system problems or organ transplant large areas of burned or damaged skin peripheral vascular disease or poor circulation taking corticosteroid medication (including steroid inhalers, cream, or lotion) an unusual or allergic reaction to ciclopirox, isopropyl alcohol, other medicines, foods, dyes, or preservatives pregnant or trying to get pregnant breast-feeding How should I use this medication? This medicine is for external use only. Follow the directions that come with this medicine exactly. Wash and dry your hands before use. Avoid contact with the eyes, mouth or nose. If you do get this medicine in your eyes, rinse out with plenty of cool tap water. Contact your doctor or health care professional if eye irritation occurs. Use at regular intervals. Do not use your medicine more often than directed. Finish the full course prescribed by your doctor or health care professional even if you think you are better. Do not stop usingexcept on your doctor's advice. Talk to your pediatrician regarding the use of this medicine in children. While this medicine may be prescribed for children as young as 12 years for selectedconditions, precautions do apply. Overdosage: If you think you have taken too much of this medicine contact apoison control center or emergency room at once. NOTE: This medicine is only for you. Do not share this medicine with others. What if I miss a dose? If you miss a dose, use it as soon as you  can. If it is almost time for yournext dose, use only that dose. Do not use double or extra doses. What may interact with this medication? Interactions are not expected. Do not use any other skin products withouttelling your doctor or health care professional. This list may not describe all possible interactions. Give your health care provider a list of all the medicines, herbs, non-prescription drugs, or dietary supplements you use. Also tell them if you smoke, drink alcohol, or use illegaldrugs. Some items may interact with your medicine. What should I watch for while using this medication? Tell your doctor or health care professional if your symptoms get worse. Four to six months of treatment may be needed for the nail(s) to improve. Somepeople may not achieve a complete cure or clearing of the nails by this time. Tell your doctor or health care professional if you develop sores or blisters that do not heal properly. If your nail infection returns after stopping usingthis product, contact your doctor or health care professional. What side effects may I notice from receiving this medication? Side effects that you should report to your doctor or health care professionalas soon as possible: allergic reactions like skin rash, itching or hives, swelling of the face, lips, or tongue severe irritation, redness, burning, blistering, peeling, swelling, oozing Side effects that usually do not require medical attention (report to yourdoctor or health care professional if they continue or are bothersome): mild reddening of the skin nail discoloration temporary burning or mild stinging at the site of application This list may not describe all possible side effects. Call your doctor for medical advice about side effects. You may report side   effects to FDA at1-800-FDA-1088. Where should I keep my medication? Keep out of the reach of children. Store at room temperature between 15 and 30 degrees C (59 and 86  degrees F). Do not freeze. Protect from light by storing the bottle in the carton after every use. This medicine is flammable. Keep away from heat and flame. Throw away anyunused medicine after the expiration date. NOTE: This sheet is a summary. It may not cover all possible information. If you have questions about this medicine, talk to your doctor, pharmacist, orhealth care provider.  2022 Elsevier/Gold Standard (2007-08-10 16:49:20)  

## 2020-11-06 NOTE — Progress Notes (Signed)
Subjective: 70 y.o. returns the office today for painful, elongated, thickened toenails which she  cannot trim herself. Denies any redness or drainage around the nails. Left side is worse than the right. Denies any acute changes since last appointment and no new complaints today. Denies any systemic complaints such as fevers, chills, nausea, vomiting.   PCP: Jac Canavan, PA-C  Objective: AAO 3, NAD DP/PT pulses palpable, CRT less than 3 seconds Nails hypertrophic, dystrophic, elongated, brittle, discolored 10. There is tenderness overlying the nails 1-5 bilaterally. There is no surrounding erythema or drainage along the nail sites. No open lesions or pre-ulcerative lesions are identified. No other areas of tenderness bilateral lower extremities. No overlying edema, erythema, increased warmth. No pain with calf compression, swelling, warmth, erythema.  Assessment: Patient presents with symptomatic onychomycosis left > right  Plan: -Treatment options including alternatives, risks, complications were discussed -Nails sharply debrided 10 without complication/bleeding.We also reviewed treatment options for the nails. Prescribed penlac to try. Discussed side affects, success rates.  -Discussed daily foot inspection. If there are any changes, to call the office immediately.  -Follow-up in 3 months or sooner if any problems are to arise. In the meantime, encouraged to call the office with any questions, concerns, changes symptoms.  Ovid Curd, DPM

## 2020-12-20 ENCOUNTER — Ambulatory Visit: Payer: Medicare HMO | Admitting: Internal Medicine

## 2020-12-20 NOTE — Progress Notes (Deleted)
Name: Gail Knapp  MRN/ DOB: [id number], [birth year]    Age/ Sex: 70 y.o., female     PCP: Jac Canavan, PA-C   Reason for Endocrinology Evaluation: ***     Initial Endocrinology Clinic Visit: ***    PATIENT IDENTIFIER: [name] is a 70 y.o., female with a past medical history of ***. She has followed with [facility] since *** for consultative assistance with management of her ***.   HISTORICAL SUMMARY: The patient was first diagnosed with *** at ***, in the setting of ***. Since that time, ***.    SUBJECTIVE:   During last visit (***): ***  Today (12/20/2020):  [name] is here for ****   ROS:  As per HPI.   HISTORY:  Past Medical History:  Past Medical History:  Diagnosis Date   Depression    Hyperlipidemia    Hypertension    Hypothyroidism    prior medication per patient as of 03/2020, but none in a while   Memory change    Memory change 2021   Requires continuous supervision for activities of daily living (ADL) 2022   Vitamin D deficiency    Past Surgical History:  Past Surgical History:  Procedure Laterality Date   COLONOSCOPY     prior, but can't recall date   Social History:  reports that she quit smoking about 5 years ago. She smoked an average of 1 pack per day. She has never used smokeless tobacco. She reports previous alcohol use. She reports that she does not use drugs. Family History:  Family History  Problem Relation Age of Onset   Hypertension Mother    Other Mother        "memory loss"   Hypertension Brother      HOME MEDICATIONS: Allergies as of 12/20/2020       Reactions   Latex Rash        Medication List        Accurate as of December 20, 2020  7:24 AM. If you have any questions, ask your nurse or doctor.          ciclopirox 8 % solution Commonly known as: Penlac Apply topically at bedtime. Apply over nail and surrounding skin. Apply daily over previous coat. After seven (7) days, may  remove with alcohol and continue cycle.   diltiazem 240 MG 24 hr capsule Commonly known as: CARDIZEM CD TAKE 1 CAPSULE BY MOUTH EVERY DAY   hydrALAZINE 100 MG tablet Commonly known as: APRESOLINE TAKE 1 TABLET BY MOUTH 3 TIMES DAILY.   hydrochlorothiazide 25 MG tablet Commonly known as: HYDRODIURIL TAKE 1 TABLET (25 MG TOTAL) BY MOUTH DAILY.   Klor-Con M10 10 MEQ tablet Generic drug: potassium chloride TAKE 1 TABLET BY MOUTH EVERY DAY   lovastatin 20 MG tablet Commonly known as: MEVACOR TAKE 1 TABLET BY MOUTH EVERYDAY AT BEDTIME   memantine 10 MG tablet Commonly known as: NAMENDA Take 1 tablet (10 mg total) by mouth 2 (two) times daily. TAKE 1 AT BEDTIME FOR 2 WEEKS, THEN TWICE DAILY   Vitamin D-3 125 MCG (5000 UT) Tabs Take 1 capsule by mouth daily.          OBJECTIVE:   PHYSICAL EXAM: VS: LMP  (LMP Unknown)    EXAM: General: Pt appears well and is in NAD  Hydration: Well-hydrated with moist mucous membranes and good skin turgor  Eyes: External eye exam normal without stare, lid lag or exophthalmos.  EOM  intact.  PERRL.  Ears, Nose, Throat: Hearing: Grossly intact bilaterally Dental: Good dentition  Throat: Clear without mass, erythema or exudate  Neck: General: Supple without adenopathy. Thyroid: Thyroid size normal.  No goiter or nodules appreciated. No thyroid bruit.  Lungs: Clear with good BS bilat with no rales, rhonchi, or wheezes  Heart: Auscultation: RRR.  Abdomen: Normoactive bowel sounds, soft, nontender, without masses or organomegaly palpable  Extremities: Gait and station: Normal gait  Digits and nails: No clubbing, cyanosis, petechiae, or nodes Head and neck: Normal alignment and mobility BL UE: Normal ROM and strength. BL LE: No pretibial edema normal ROM and strength.  Skin: Hair: Texture and amount normal with gender appropriate distribution Skin Inspection: No rashes, acanthosis nigricans/skin tags. No lipohypertrophy Skin Palpation: Skin  temperature, texture, and thickness normal to palpation  Neuro: Cranial nerves: II - XII grossly intact  Cerebellar: Normal coordination and movement; no tremor Motor: Normal strength throughout DTRs: 2+ and symmetric in UE without delay in relaxation phase  Mental Status: Judgment, insight: Intact Orientation: Oriented to time, place, and person Memory: Intact for recent and remote events Mood and affect: No depression, anxiety, or agitation     DATA REVIEWED: ***    ASSESSMENT / PLAN / RECOMMENDATIONS:   ***  Plan: ***    Medications   ***   Signed electronically by: Lyndle Herrlich, MD  [facility] Endocrinology  [facility] [address] [place], [place] [address] Phone: [phone] FAX: [phone]      CC: Jac Canavan, PA-C [address] [place] [address] Phone: [phone]  Fax: [phone]   Return to Endocrinology clinic as below: Future Appointments  Date Time Provider Department Center  12/20/2020 10:30 AM Amica Harron, Konrad Dolores, MD [facility] None  12/22/2020  8:30 AM [facility] MM 2 [facility]  12/22/2020  9:00 AM [facility] DX DEXA 1 [facility]  02/05/2021 11:15 AM Vivi Barrack, DPM [facility]  07/17/2021  8:30 AM Tysinger, Kermit Balo, PA-C [facility]

## 2020-12-22 ENCOUNTER — Ambulatory Visit
Admission: RE | Admit: 2020-12-22 | Discharge: 2020-12-22 | Disposition: A | Discharge status: Home / Self Care | Payer: Medicare HMO | Source: Ambulatory Visit | Attending: Medical | Admitting: Medical

## 2020-12-22 ENCOUNTER — Other Ambulatory Visit: Payer: Self-pay

## 2020-12-22 DIAGNOSIS — E2839 Other primary ovarian failure: Secondary | ICD-10-CM

## 2020-12-22 DIAGNOSIS — Z1231 Encounter for screening mammogram for malignant neoplasm of breast: Secondary | ICD-10-CM

## 2020-12-22 DIAGNOSIS — Z0000 Encounter for general adult medical examination without abnormal findings: Secondary | ICD-10-CM

## 2020-12-27 ENCOUNTER — Other Ambulatory Visit: Payer: Self-pay | Admitting: Medical

## 2020-12-27 DIAGNOSIS — R928 Other abnormal and inconclusive findings on diagnostic imaging of breast: Secondary | ICD-10-CM

## 2021-01-01 ENCOUNTER — Other Ambulatory Visit: Payer: Self-pay | Admitting: Medical

## 2021-01-03 ENCOUNTER — Other Ambulatory Visit: Payer: Self-pay | Admitting: Medical

## 2021-01-06 ENCOUNTER — Other Ambulatory Visit: Payer: Self-pay | Admitting: Medical

## 2021-01-11 ENCOUNTER — Ambulatory Visit
Admission: RE | Admit: 2021-01-11 | Discharge: 2021-01-11 | Disposition: A | Discharge status: Home / Self Care | Payer: Medicare (Managed Care) | Source: Ambulatory Visit | Attending: Medical | Admitting: Medical

## 2021-01-11 ENCOUNTER — Other Ambulatory Visit: Payer: Self-pay

## 2021-01-11 DIAGNOSIS — R928 Other abnormal and inconclusive findings on diagnostic imaging of breast: Secondary | ICD-10-CM

## 2021-02-05 ENCOUNTER — Ambulatory Visit: Payer: Medicare (Managed Care) | Admitting: Podiatry

## 2021-05-07 ENCOUNTER — Other Ambulatory Visit: Payer: Self-pay | Admitting: Family Medicine

## 2021-05-07 DIAGNOSIS — E559 Vitamin D deficiency, unspecified: Secondary | ICD-10-CM

## 2021-07-17 ENCOUNTER — Ambulatory Visit: Payer: Medicare HMO | Admitting: Medical

## 2021-08-06 ENCOUNTER — Telehealth: Payer: Self-pay | Admitting: Internal Medicine

## 2021-08-06 NOTE — Telephone Encounter (Signed)
Pt no showed for appt in February. Called son and and left message to call to reschedule ?

## 2021-08-06 NOTE — Telephone Encounter (Signed)
Gail Knapp pt's son called and left voicemail that pt is now going to [facility] and they have a primary care there that shes been seeing so not needed here at this time. I will take pcp off for now ?

## 2021-10-05 ENCOUNTER — Ambulatory Visit
Admission: RE | Admit: 2021-10-05 | Discharge: 2021-10-05 | Disposition: A | Discharge status: Home / Self Care | Payer: Medicare (Managed Care) | Source: Ambulatory Visit | Attending: Family Medicine | Admitting: Family Medicine

## 2021-10-05 DIAGNOSIS — E559 Vitamin D deficiency, unspecified: Secondary | ICD-10-CM

## 2022-01-02 ENCOUNTER — Other Ambulatory Visit: Payer: Self-pay | Admitting: Internal Medicine

## 2022-01-02 DIAGNOSIS — Z1231 Encounter for screening mammogram for malignant neoplasm of breast: Secondary | ICD-10-CM

## 2022-01-29 ENCOUNTER — Ambulatory Visit
Admission: RE | Admit: 2022-01-29 | Discharge: 2022-01-29 | Disposition: A | Discharge status: Home / Self Care | Payer: Medicare (Managed Care) | Source: Ambulatory Visit | Attending: Internal Medicine | Admitting: Internal Medicine

## 2022-01-29 DIAGNOSIS — Z1231 Encounter for screening mammogram for malignant neoplasm of breast: Secondary | ICD-10-CM

## 2022-07-02 ENCOUNTER — Other Ambulatory Visit: Payer: Self-pay | Admitting: Internal Medicine

## 2022-07-02 ENCOUNTER — Ambulatory Visit
Admission: RE | Admit: 2022-07-02 | Discharge: 2022-07-02 | Disposition: A | Discharge status: Home / Self Care | Payer: No Typology Code available for payment source | Source: Ambulatory Visit | Attending: Internal Medicine | Admitting: Internal Medicine

## 2022-07-02 DIAGNOSIS — M79605 Pain in left leg: Secondary | ICD-10-CM

## 2022-07-02 DIAGNOSIS — M25552 Pain in left hip: Secondary | ICD-10-CM

## 2022-07-09 ENCOUNTER — Other Ambulatory Visit: Payer: Self-pay | Admitting: Internal Medicine

## 2022-07-09 ENCOUNTER — Ambulatory Visit
Admission: RE | Admit: 2022-07-09 | Discharge: 2022-07-09 | Disposition: A | Discharge status: Home / Self Care | Payer: No Typology Code available for payment source | Source: Ambulatory Visit | Attending: Internal Medicine | Admitting: Internal Medicine

## 2022-07-09 DIAGNOSIS — M25551 Pain in right hip: Secondary | ICD-10-CM

## 2022-07-09 DIAGNOSIS — M25562 Pain in left knee: Secondary | ICD-10-CM

## 2022-07-09 DIAGNOSIS — M898X5 Other specified disorders of bone, thigh: Secondary | ICD-10-CM

## 2022-07-09 DIAGNOSIS — M25561 Pain in right knee: Secondary | ICD-10-CM

## 2022-07-29 ENCOUNTER — Other Ambulatory Visit: Payer: Self-pay | Admitting: Internal Medicine

## 2022-07-29 DIAGNOSIS — M79652 Pain in left thigh: Secondary | ICD-10-CM

## 2022-08-23 ENCOUNTER — Other Ambulatory Visit: Payer: Medicare (Managed Care)

## 2022-09-01 ENCOUNTER — Emergency Department (HOSPITAL_COMMUNITY)
Admission: EM | Admit: 2022-09-01 | Discharge: 2022-09-02 | Disposition: A | Discharge status: Home / Self Care | Payer: Medicare (Managed Care) | Attending: Emergency Medicine | Admitting: Emergency Medicine

## 2022-09-01 ENCOUNTER — Encounter (HOSPITAL_COMMUNITY): Payer: Self-pay | Admitting: Emergency Medicine

## 2022-09-01 DIAGNOSIS — W19XXXA Unspecified fall, initial encounter: Secondary | ICD-10-CM | POA: Diagnosis not present

## 2022-09-01 DIAGNOSIS — N189 Chronic kidney disease, unspecified: Secondary | ICD-10-CM | POA: Diagnosis not present

## 2022-09-01 DIAGNOSIS — R040 Epistaxis: Secondary | ICD-10-CM | POA: Insufficient documentation

## 2022-09-01 DIAGNOSIS — S0083XA Contusion of other part of head, initial encounter: Secondary | ICD-10-CM | POA: Diagnosis not present

## 2022-09-01 DIAGNOSIS — M25511 Pain in right shoulder: Secondary | ICD-10-CM | POA: Diagnosis not present

## 2022-09-01 DIAGNOSIS — I129 Hypertensive chronic kidney disease with stage 1 through stage 4 chronic kidney disease, or unspecified chronic kidney disease: Secondary | ICD-10-CM | POA: Diagnosis not present

## 2022-09-01 DIAGNOSIS — Z91040 Latex allergy status: Secondary | ICD-10-CM | POA: Diagnosis not present

## 2022-09-01 DIAGNOSIS — F0390 Unspecified dementia without behavioral disturbance: Secondary | ICD-10-CM | POA: Diagnosis not present

## 2022-09-01 DIAGNOSIS — S0990XA Unspecified injury of head, initial encounter: Secondary | ICD-10-CM | POA: Diagnosis present

## 2022-09-01 DIAGNOSIS — Z79899 Other long term (current) drug therapy: Secondary | ICD-10-CM | POA: Diagnosis not present

## 2022-09-01 LAB — CBG MONITORING, ED: Glucose-Capillary: 88 mg/dL (ref 70–99)

## 2022-09-01 NOTE — ED Triage Notes (Signed)
Patient arrived after an unwitnessed fall, no loc or complaints of pain. Staff stated she had a nosebleed but controlled prior to ems transport. Oriented x2 at baseline.

## 2022-09-01 NOTE — ED Provider Notes (Signed)
[place] EMERGENCY DEPARTMENT AT [facility] Provider Note   CSN: [id number] Arrival date & time: 09/01/22  2236     History {Add pertinent medical, surgical, social history, OB history to HPI:1} Chief Complaint  Patient presents with   Gail Knapp is a 72 y.o. female.  The history is provided by the patient and medical records.  Fall  Gail Knapp is a 72 y.o. female who presents to the Emergency Department complaining of *** Fall - hit head.   [place]  Got back up, did not pass out.  Had head pain - now gone No neck chest ap back pain  No illnesses   Hx/o ckd, htn, hpl, dementia  Reneethompson [phone]     Home Medications Prior to Admission medications   Medication Sig Start Date End Date Taking? Authorizing Provider  Cholecalciferol (VITAMIN D-3) 125 MCG (5000 UT) TABS Take 1 capsule by mouth daily. 07/17/20   Tysinger, Kermit Balo, PA-C  ciclopirox (PENLAC) 8 % solution Apply topically at bedtime. Apply over nail and surrounding skin. Apply daily over previous coat. After seven (7) days, may remove with alcohol and continue cycle. 11/02/20   Vivi Barrack, DPM  diltiazem (CARDIZEM CD) 240 MG 24 hr capsule TAKE 1 CAPSULE BY MOUTH EVERY DAY 01/01/21   Tysinger, Kermit Balo, PA-C  hydrALAZINE (APRESOLINE) 100 MG tablet TAKE 1 TABLET BY MOUTH THREE TIMES A DAY 01/08/21   Tysinger, Kermit Balo, PA-C  hydrochlorothiazide (HYDRODIURIL) 25 MG tablet TAKE 1 TABLET (25 MG TOTAL) BY MOUTH DAILY. 01/08/21   Tysinger, Kermit Balo, PA-C  KLOR-CON M10 10 MEQ tablet TAKE 1 TABLET BY MOUTH EVERY DAY 01/08/21   Tysinger, Kermit Balo, PA-C  lovastatin (MEVACOR) 20 MG tablet TAKE 1 TABLET BY MOUTH EVERYDAY AT BEDTIME 01/08/21   Tysinger, Kermit Balo, PA-C  memantine (NAMENDA) 10 MG tablet TAKE 1 TABLET (10 MG TOTAL) BY MOUTH 2 (TWO) TIMES DAILY. TAKE 1 AT BEDTIME FOR 2 WEEKS, THEN TWICE DAILY 01/03/21   Tysinger, Kermit Balo, PA-C      Allergies    Latex    Review of  Systems   Review of Systems  All other systems reviewed and are negative.   Physical Exam Updated Vital Signs BP (!) 141/73 (BP Location: Right Arm)   Pulse 91   Temp 98.5 F (36.9 C) (Oral)   Resp 19   Ht  (1.651 m)   Wt 56.7 kg   LMP  (LMP Unknown)   SpO2 98%   BMI 20.80 kg/m  Physical Exam Vitals and nursing note reviewed.  Constitutional:      Appearance: She is well-developed.  HENT:     Head: Normocephalic and atraumatic.  Cardiovascular:     Rate and Rhythm: Normal rate and regular rhythm.     Heart sounds: No murmur heard. Pulmonary:     Effort: Pulmonary effort is normal. No respiratory distress.     Breath sounds: Normal breath sounds.  Abdominal:     Palpations: Abdomen is soft.     Tenderness: There is no abdominal tenderness. There is no guarding or rebound.  Musculoskeletal:     Comments: TTP over right shoulder with decreased ROM in right shoulder - unclear if acute or chronic.  No tenderness over the hips bilaterally.    Skin:    General: Skin is warm and dry.  Neurological:     Mental Status: She is alert.     Comments: Oriented  to "" disoriented to time, place.  5/5 grip strength in BUE.  Able to lift BLE from the stretcher.   Psychiatric:        Behavior: Behavior normal.     ED Results / Procedures / Treatments   Labs (all labs ordered are listed, but only abnormal results are displayed) Labs Reviewed  CBG MONITORING, ED    EKG None  Radiology No results found.  Procedures Procedures  {Document cardiac monitor, telemetry assessment procedure when appropriate:1}  Medications Ordered in ED Medications - No data to display  ED Course/ Medical Decision Making/ A&P   {   Click here for ABCD2, HEART and other calculatorsREFRESH Note before signing :1}                          Medical Decision Making  ***  {Document critical care time when appropriate:1} {Document review of labs and clinical decision tools ie heart  score, Chads2Vasc2 etc:1}  {Document your independent review of radiology images, and any outside records:1} {Document your discussion with family members, caretakers, and with consultants:1} {Document social determinants of health affecting pt's care:1} {Document your decision making why or why not admission, treatments were needed:1} Final Clinical Impression(s) / ED Diagnoses Final diagnoses:  None    Rx / DC Orders ED Discharge Orders     None

## 2022-09-02 ENCOUNTER — Emergency Department (HOSPITAL_COMMUNITY): Payer: Medicare (Managed Care)

## 2022-09-02 NOTE — ED Notes (Signed)
Ambulated pt. Pt required x1 staff assistance from lying to sitting position in bed. Pt was able to ambulate with x1 staff assist. Pt presented with some unsteadiness, but was able to ambulate with standby assist.

## 2022-09-13 ENCOUNTER — Ambulatory Visit
Admission: RE | Admit: 2022-09-13 | Discharge: 2022-09-13 | Disposition: A | Discharge status: Home / Self Care | Payer: Medicare (Managed Care) | Source: Ambulatory Visit | Attending: Internal Medicine

## 2022-09-13 DIAGNOSIS — M79652 Pain in left thigh: Secondary | ICD-10-CM

## 2022-11-18 DEATH — deceased

## 2023-08-19 IMAGING — MG MM DIGITAL DIAGNOSTIC UNILAT*L* W/ TOMO W/ CAD
6 series · 6 of 18 positions shown · non-contrast
Comparison: Baseline screening mammogram dated 12/22/2020.

CLINICAL DATA: Patient returns today to evaluate 2 possible masses
within the LEFT breast which were identified on a baseline screening
mammogram.

EXAM:
DIGITAL DIAGNOSTIC UNILATERAL LEFT MAMMOGRAM WITH TOMOSYNTHESIS AND
CAD; ULTRASOUND LEFT BREAST LIMITED
TECHNIQUE: Left digital diagnostic mammography and breast tomosynthesis was
performed. The images were evaluated with computer-aided detection.;
Targeted ultrasound examination of the left breast was performed.

[L CC synth-2D (1 of 2)]
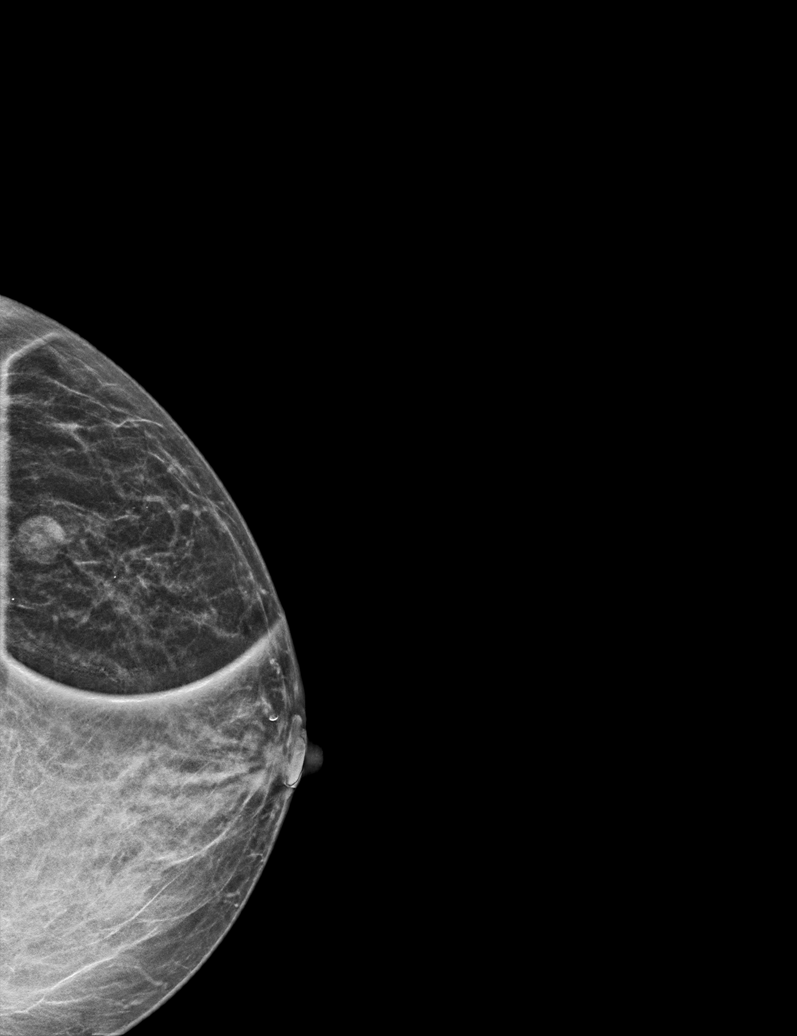

[L CC synth-2D (2 of 2)]
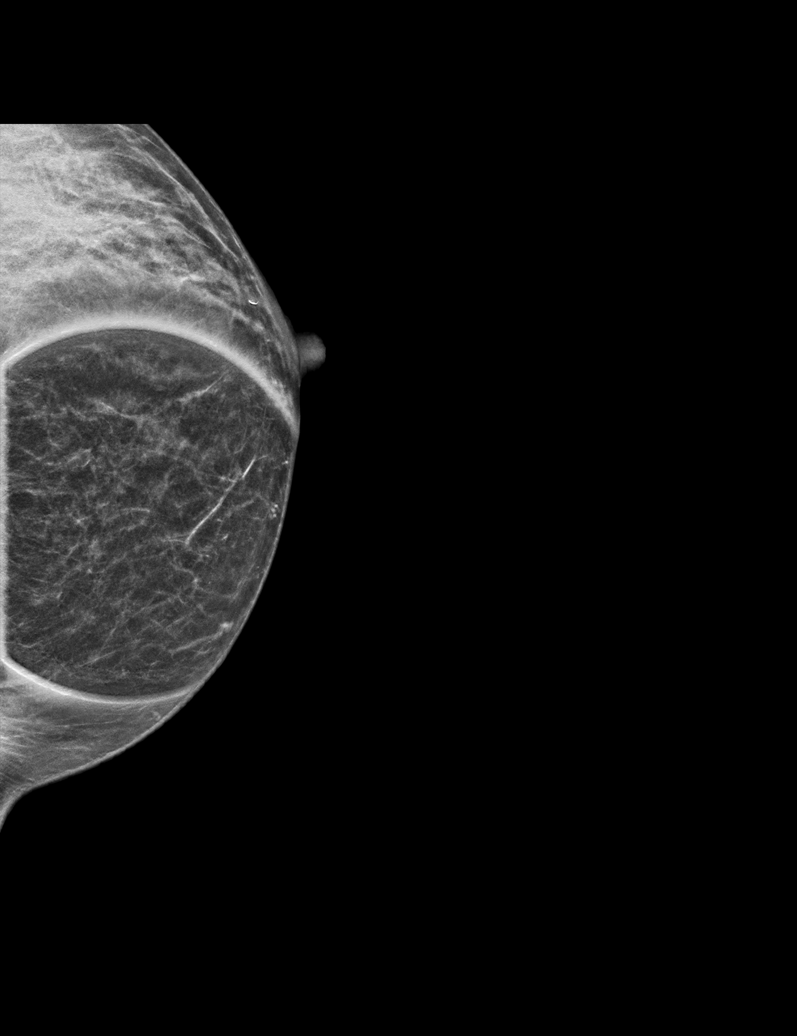

[L MLO synth-2D]
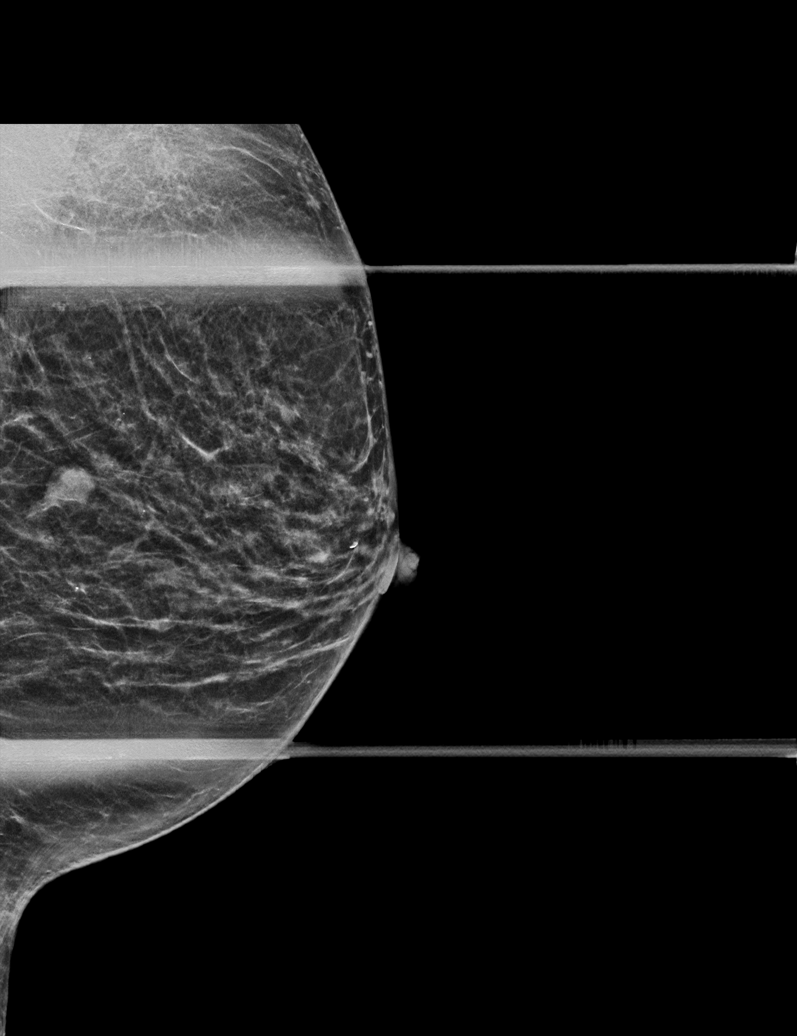

[L CC tomo (1 of 2) · tomo slice 20/39.0]
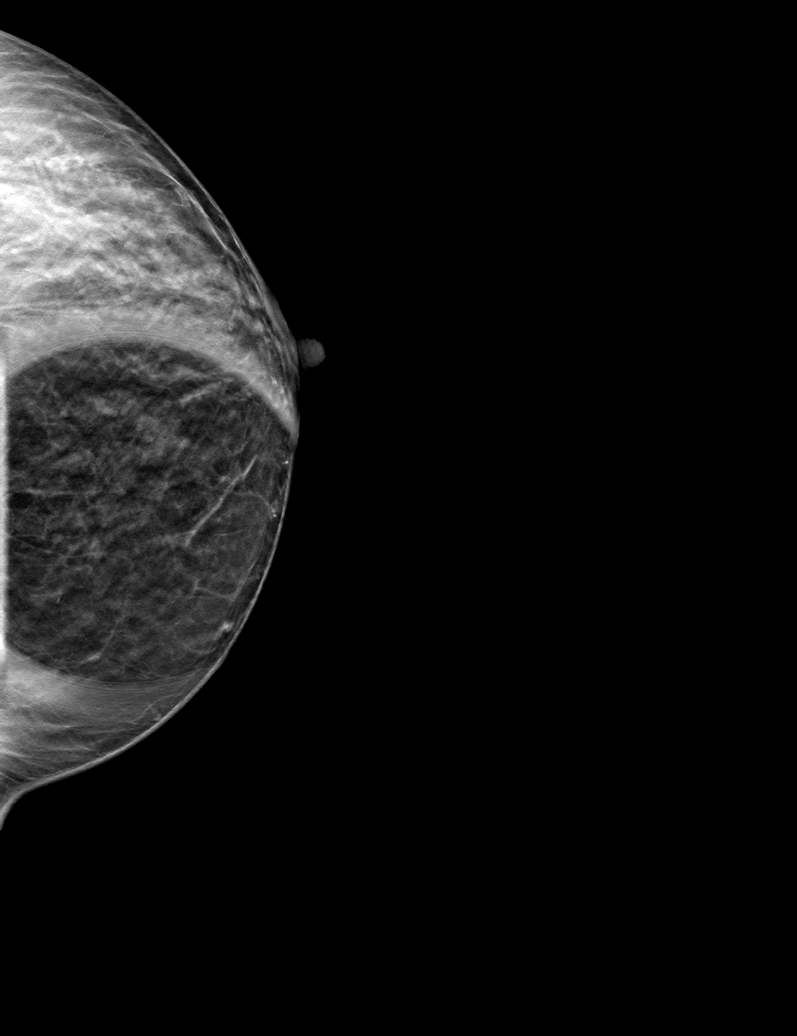

[L MLO tomo · tomo slice 23/45.0]
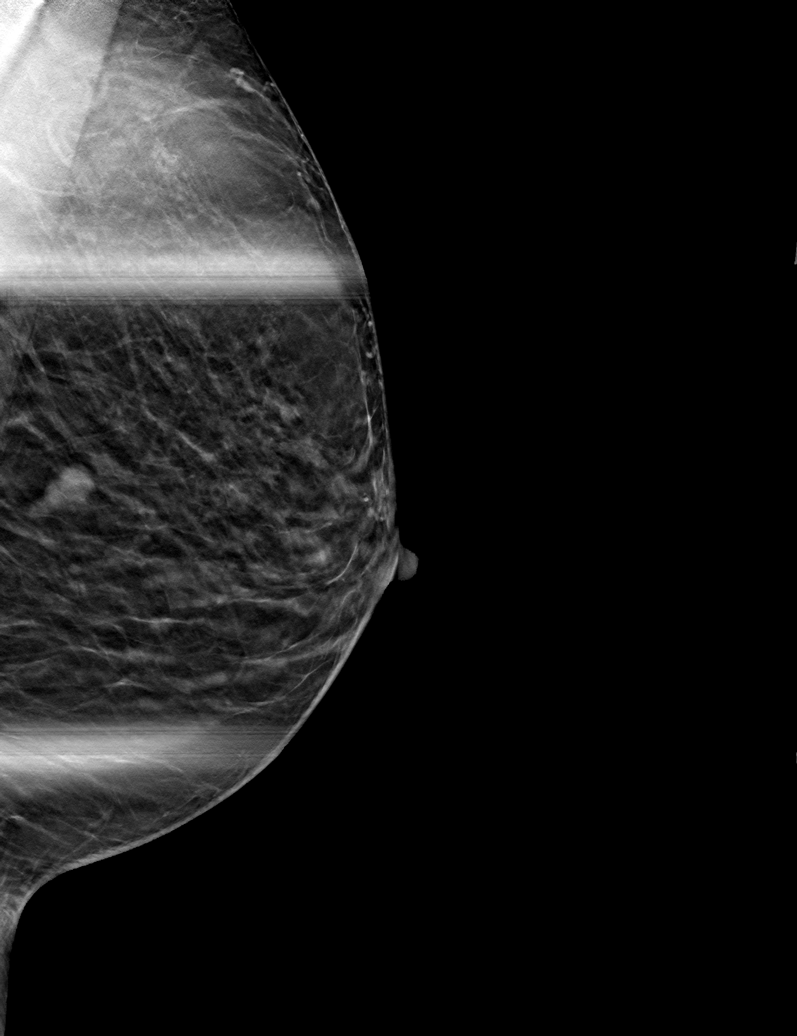

[L CC tomo (2 of 2) · tomo slice 21/40.0]
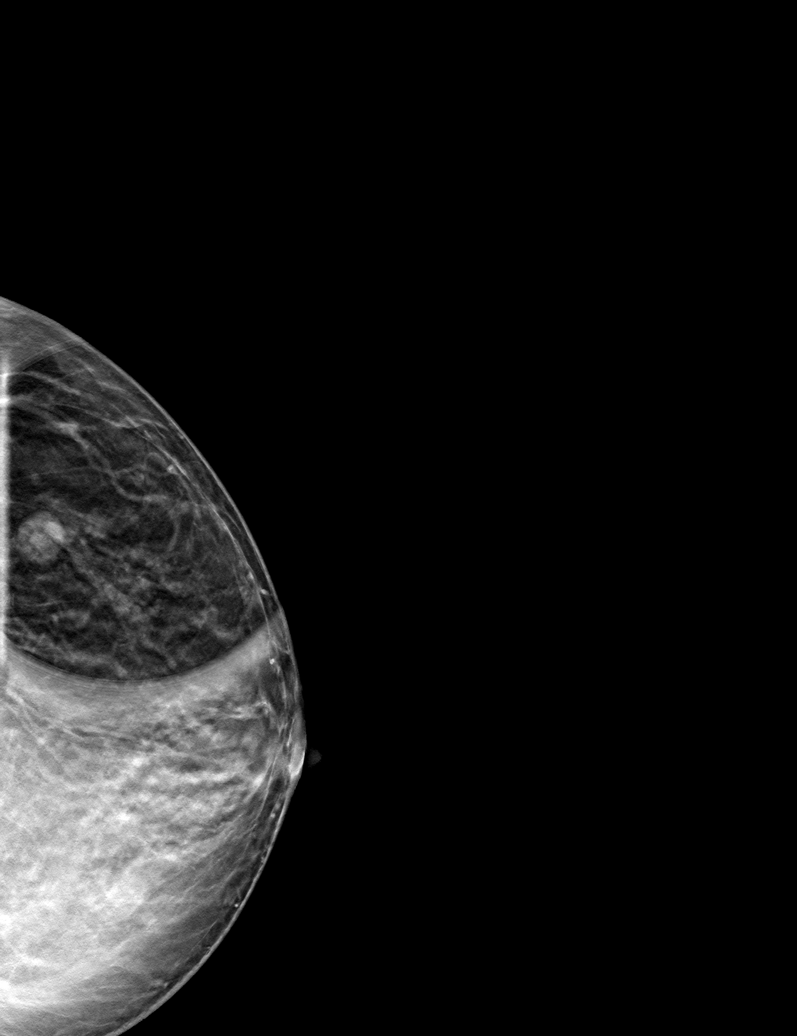

[6 of 18 positions shown; findings below may reference images not displayed]

ACR Breast Density Category b: There are scattered areas of
fibroglandular density.
FINDINGS: On today's additional diagnostic views with spot compression and 3D
tomosynthesis, there is a persistent mass within the outer LEFT
breast, measuring approximately 1.2 cm greatest dimension.

The additional questioned mass within the inner LEFT breast is
resolved with spot compression indicating superimposition of normal
fibroglandular tissues. Ultrasound will also be performed of this
area to ensure benignity.

Targeted ultrasound is performed, showing a benign cluster of cysts
within the LEFT breast at the 3 o'clock axis, 4 cm from the nipple,
measuring 1.2 x 0.5 x 1.1 cm, corresponding to the mammographic
finding.

Additional evaluation of the inner LEFT breast with ultrasound shows
only normal fibroglandular tissues and fat lobules throughout. No
additional solid or cystic mass.
IMPRESSION: 1. Benign cluster of cysts within the LEFT breast at the 3 o'clock
axis, measuring 1.2 cm, corresponding to the mammographic findings.
2. No evidence of malignancy within the LEFT breast.

RECOMMENDATION:
Screening mammogram in one year.(Code:0H-[id number])

I have discussed the findings and recommendations with the patient.
If applicable, a reminder letter will be sent to the patient
regarding the next appointment.

BI-RADS CATEGORY  2: Benign.
